# Patient Record
Sex: Male | Born: 1975 | Race: Black or African American | Hispanic: No | Marital: Single | Smoking: Never smoker
Health system: Southern US, Community
[De-identification: ages and names within clinical notes are randomized; demographics above are authoritative.]

## PROBLEM LIST (undated history)

## (undated) DIAGNOSIS — J45909 Unspecified asthma, uncomplicated: Secondary | ICD-10-CM

## (undated) DIAGNOSIS — J9859 Other diseases of mediastinum, not elsewhere classified: Secondary | ICD-10-CM

## (undated) DIAGNOSIS — Z889 Allergy status to unspecified drugs, medicaments and biological substances status: Secondary | ICD-10-CM

## (undated) DIAGNOSIS — Z9189 Other specified personal risk factors, not elsewhere classified: Secondary | ICD-10-CM

## (undated) HISTORY — DX: Other specified personal risk factors, not elsewhere classified: Z91.89

## (undated) HISTORY — DX: Allergy status to unspecified drugs, medicaments and biological substances: Z88.9

## (undated) HISTORY — DX: Other diseases of mediastinum, not elsewhere classified: J98.59

---

## 1998-02-06 ENCOUNTER — Emergency Department (HOSPITAL_COMMUNITY): Admission: EM | Admit: 1998-02-06 | Discharge: 1998-02-06 | Payer: Self-pay | Admitting: Emergency Medicine

## 1999-03-17 ENCOUNTER — Emergency Department (HOSPITAL_COMMUNITY): Admission: EM | Admit: 1999-03-17 | Discharge: 1999-03-17 | Payer: Self-pay | Admitting: Emergency Medicine

## 1999-03-17 ENCOUNTER — Encounter: Payer: Self-pay | Admitting: Emergency Medicine

## 2000-09-04 ENCOUNTER — Emergency Department (HOSPITAL_COMMUNITY): Admission: EM | Admit: 2000-09-04 | Discharge: 2000-09-05 | Payer: Self-pay | Admitting: Emergency Medicine

## 2000-09-04 ENCOUNTER — Encounter: Payer: Self-pay | Admitting: Emergency Medicine

## 2001-10-18 ENCOUNTER — Emergency Department (HOSPITAL_COMMUNITY): Admission: EM | Admit: 2001-10-18 | Discharge: 2001-10-19 | Payer: Self-pay

## 2012-07-02 ENCOUNTER — Emergency Department (HOSPITAL_COMMUNITY)
Admission: EM | Admit: 2012-07-02 | Discharge: 2012-07-02 | Disposition: A | Discharge status: Home / Self Care | Payer: BC Managed Care – PPO | Attending: Emergency Medicine | Admitting: Emergency Medicine

## 2012-07-02 DIAGNOSIS — M25469 Effusion, unspecified knee: Secondary | ICD-10-CM | POA: Insufficient documentation

## 2012-07-02 DIAGNOSIS — M7040 Prepatellar bursitis, unspecified knee: Secondary | ICD-10-CM | POA: Insufficient documentation

## 2012-07-02 MED ORDER — ACETAMINOPHEN 325 MG PO TABS
650.0000 mg | ORAL_TABLET | Freq: Once | ORAL | Status: AC
  Administered 2012-07-02: 650 mg via ORAL

## 2012-07-02 MED ORDER — OXYCODONE-ACETAMINOPHEN 5-325 MG PO TABS: ORAL_TABLET | ORAL | Status: DC

## 2012-07-02 MED ORDER — ACETAMINOPHEN 325 MG PO TABS
ORAL_TABLET | ORAL | Status: AC
  Administered 2012-07-02: 650 mg via ORAL
  Filled 2012-07-02: qty 2

## 2012-07-02 NOTE — ED Notes (Signed)
Pt states he woke up this morning with pain and swelling to R knee. Pt went to [facility] for same today and was given Toradol for pain. Pt states they x-rayed his knee and told him there was lots of fluid on his knee. Pt denies any trauma or injury to knee. Pt ambulatory with limp to exam room.

## 2012-07-02 NOTE — ED Notes (Signed)
Pt states he has crutches at home and doesn't need them.

## 2012-07-02 NOTE — ED Provider Notes (Signed)
History   This chart was scribed for Wynetta Emery PA-C, non-physician practitioner, working with Hurman Horn, MD by Charolett Bumpers, ED Scribe. This patient was seen in room WTR7/WTR7 and the patient's care was started at 2025.    CSN: [id number]  Arrival date & time 07/02/12  1950   First MD Initiated Contact with Patient 07/02/12 2025      Chief Complaint  Patient presents with  . Knee Pain    The history is provided by the patient. No language interpreter was used.  Jesse Rowe is a 37 y.o. male who presents to the Emergency Department complaining of constant, severe right knee pain with associated swelling. He states he woke up with these symptoms this morning. He went to [facility] today and was given IM Toradol for pain. He states his knee was x-rayed and told he had a lot of fluid on his knee. He denies any known injuries, trauma or falls. He rates his pain 7/10 at rest, 10/10 when he ambulates which exacerbates his pain. He denies any fevers. He reports a h/o similar symptoms in his right knee in 1994. He denies any h/o STD or recent dysuria, penile discharge, fever or rash.   No past medical history on file.  No past surgical history on file.  No family history on file.  History  Substance Use Topics  . Smoking status: Not on file  . Smokeless tobacco: Not on file  . Alcohol Use: Not on file      Review of Systems  Constitutional: Negative for fever and chills.  Respiratory: Negative for shortness of breath.   Cardiovascular: Negative for chest pain.  Gastrointestinal: Negative for nausea, vomiting, abdominal pain and diarrhea.  Genitourinary: Negative for dysuria and discharge.  Musculoskeletal: Positive for joint swelling and arthralgias.  All other systems reviewed and are negative.    Allergies  Review of patient's allergies indicates no known allergies.  Home Medications  No current outpatient prescriptions on file.  BP 142/81   Pulse 108  Temp 97.8 F (36.6 C) (Oral)  Resp 20  SpO2 98%  Physical Exam  Nursing note and vitals reviewed. Constitutional: He is oriented to person, place, and time. He appears well-developed and well-nourished. No distress.  HENT:  Head: Normocephalic.  Eyes: Conjunctivae normal and EOM are normal.  Neck: Neck supple.  Cardiovascular: Normal rate.   Pulmonary/Chest: Effort normal. No stridor. No respiratory distress.  Musculoskeletal: Normal range of motion. He exhibits tenderness.       Right knee with moderate effusion and diffuse tenderness to palpation. No warmth, erythema or induration noted. Full active ROM with pain.   Neurological: He is alert and oriented to person, place, and time.  Skin: Skin is warm and dry.  Psychiatric: He has a normal mood and affect.    ED Course  Procedures (including critical care time)  DIAGNOSTIC STUDIES: Oxygen Saturation is 98% on room air, normal by my interpretation.    COORDINATION OF CARE:  20:40-Discussed planned course of treatment with the patient, who is agreeable at this time.   20:50-Pt was examined by Dr. Fonnie Jarvis. Will d/c pt home with crutches, knee immobilizer and prescription for Percocet.  Labs Reviewed - No data to display No results found.   1. Prepatellar bursitis       MDM  Doubt septic joint based on physical exam. Discussed case with attending who evaluated the patient personally. He also agrees that a joint tap is necessary at this  time.   Pt verbalized understanding and agrees with care plan. Outpatient follow-up and return precautions given.    New Prescriptions   OXYCODONE-ACETAMINOPHEN (PERCOCET/ROXICET) 5-325 MG PER TABLET    1 to 2 tabs PO q6hrs  PRN for pain    I personally performed the services described in this documentation, which was scribed in my presence. The recorded information has been reviewed and is accurate.    Wynetta Emery, PA-C 07/02/12 2245

## 2012-07-05 NOTE — ED Provider Notes (Signed)
Medical screening examination/treatment/procedure(s) were conducted as a shared visit with non-physician practitioner(s) and myself.  I personally evaluated the patient during the encounter.  No erythema, no increased warmth, and no lateral medial or posterior joint tenderness, the patient appears to have prepatellar and suprapatellar tenderness only and I doubt septic arthritis.  Hurman Horn, MD 07/05/12 [phone]

## 2013-12-14 ENCOUNTER — Encounter (INDEPENDENT_AMBULATORY_CARE_PROVIDER_SITE_OTHER): Payer: Self-pay | Admitting: Ophthalmology

## 2017-06-12 ENCOUNTER — Other Ambulatory Visit: Payer: Self-pay | Admitting: Otolaryngology

## 2017-06-15 ENCOUNTER — Other Ambulatory Visit: Payer: Self-pay | Admitting: Otolaryngology

## 2017-06-15 DIAGNOSIS — J329 Chronic sinusitis, unspecified: Secondary | ICD-10-CM

## 2017-06-15 DIAGNOSIS — J342 Deviated nasal septum: Secondary | ICD-10-CM

## 2017-06-19 ENCOUNTER — Ambulatory Visit
Admission: RE | Admit: 2017-06-19 | Discharge: 2017-06-19 | Disposition: A | Discharge status: Home / Self Care | Payer: BC Managed Care – PPO | Source: Ambulatory Visit | Attending: Otolaryngology | Admitting: Otolaryngology

## 2017-06-19 DIAGNOSIS — J342 Deviated nasal septum: Secondary | ICD-10-CM

## 2017-06-19 DIAGNOSIS — J329 Chronic sinusitis, unspecified: Secondary | ICD-10-CM

## 2017-08-31 ENCOUNTER — Emergency Department (HOSPITAL_COMMUNITY)
Admission: EM | Admit: 2017-08-31 | Discharge: 2017-08-31 | Disposition: A | Discharge status: Home / Self Care | Payer: BC Managed Care – PPO | Attending: Emergency Medicine | Admitting: Emergency Medicine

## 2017-08-31 ENCOUNTER — Other Ambulatory Visit: Payer: Self-pay | Admitting: Internal Medicine

## 2017-08-31 ENCOUNTER — Other Ambulatory Visit: Payer: Self-pay

## 2017-08-31 ENCOUNTER — Emergency Department (HOSPITAL_COMMUNITY): Payer: BC Managed Care – PPO

## 2017-08-31 ENCOUNTER — Other Ambulatory Visit (HOSPITAL_COMMUNITY): Payer: Self-pay | Admitting: Internal Medicine

## 2017-08-31 ENCOUNTER — Ambulatory Visit
Admission: RE | Admit: 2017-08-31 | Discharge: 2017-08-31 | Disposition: A | Discharge status: Home / Self Care | Payer: BC Managed Care – PPO | Source: Ambulatory Visit | Attending: Internal Medicine | Admitting: Internal Medicine

## 2017-08-31 ENCOUNTER — Encounter (HOSPITAL_COMMUNITY): Payer: Self-pay | Admitting: Emergency Medicine

## 2017-08-31 DIAGNOSIS — Z79899 Other long term (current) drug therapy: Secondary | ICD-10-CM | POA: Insufficient documentation

## 2017-08-31 DIAGNOSIS — R071 Chest pain on breathing: Secondary | ICD-10-CM

## 2017-08-31 DIAGNOSIS — R079 Chest pain, unspecified: Secondary | ICD-10-CM | POA: Diagnosis present

## 2017-08-31 DIAGNOSIS — R222 Localized swelling, mass and lump, trunk: Secondary | ICD-10-CM | POA: Diagnosis not present

## 2017-08-31 LAB — BASIC METABOLIC PANEL
ANION GAP: 9 (ref 5–15)
BUN: 10 mg/dL (ref 6–20)
CHLORIDE: 103 mmol/L (ref 101–111)
CO2: 27 mmol/L (ref 22–32)
Calcium: 9.5 mg/dL (ref 8.9–10.3)
Creatinine, Ser: 1.17 mg/dL (ref 0.61–1.24)
GFR calc Af Amer: >60 mL/min (ref >60)
Glucose, Bld: 93 mg/dL (ref 65–99)
POTASSIUM: 3.6 mmol/L (ref 3.5–5.1)
Sodium: 139 mmol/L (ref 135–145)

## 2017-08-31 LAB — CBC
HEMATOCRIT: 44.4 % (ref 39.0–52.0)
HEMOGLOBIN: 14.7 g/dL (ref 13.0–17.0)
MCH: 29.6 pg (ref 26.0–34.0)
MCHC: 33.1 g/dL (ref 30.0–36.0)
MCV: 89.3 fL (ref 78.0–100.0)
Platelets: 221 10*3/uL (ref 150–400)
RBC: 4.97 MIL/uL (ref 4.22–5.81)
RDW: 12.2 % (ref 11.5–15.5)
WBC: 8.4 10*3/uL (ref 4.0–10.5)

## 2017-08-31 LAB — I-STAT TROPONIN, ED
TROPONIN I, POC: 0.02 ng/mL (ref 0.00–0.08)
Troponin i, poc: 0 ng/mL (ref 0.00–0.08)

## 2017-08-31 MED ORDER — HYDROCODONE-ACETAMINOPHEN 5-325 MG PO TABS
1.0000 | ORAL_TABLET | Freq: Once | ORAL | Status: AC
  Administered 2017-08-31: 1 via ORAL
  Filled 2017-08-31: qty 1

## 2017-08-31 MED ORDER — IOPAMIDOL (ISOVUE-370) INJECTION 76%
100.0000 mL | Freq: Once | INTRAVENOUS | Status: AC | PRN
  Administered 2017-08-31: 100 mL via INTRAVENOUS

## 2017-08-31 MED ORDER — HYDROCODONE-ACETAMINOPHEN 5-325 MG PO TABS: 1.0000 | ORAL_TABLET | Freq: Four times a day (QID) | ORAL | 0 refills | Status: DC | PRN

## 2017-08-31 NOTE — ED Provider Notes (Signed)
Patient placed in Quick Look pathway, seen and evaluated   Chief Complaint: Chest pain  HPI:   Patient began having sudden onset chest pain, central, radiating to the right, moderate to severe, constant since onset around 8 AM this morning.  Went to his PCP.  He was told his EKG was concerning.  There is also a mass noted on chest x-ray.  Increased pain with deep breathing.  Denies cough, fever, vomiting, peripheral edema.  ROS: Chest pain (one)  Physical Exam:   Gen: No distress  Neuro: Awake and Alert  Skin: Warm    Focused Exam:   No diaphoresis.  No pallor.  Pulmonary: No increased work of breathing.  Speaks in full sentences without difficulty.  Lung sounds clear.  No tachypnea.  Cardiac: Normal rate and regular. Peripheral pulses intact.  Abdominal: No abdominal tenderness. No guarding.    MSK: No peripheral edema.   Initiation of care has begun. The patient has been counseled on the process, plan, and necessity for staying for the completion/evaluation, and the remainder of the medical screening examination   Jesse Rowe 08/31/17 [place], Nathan, MD 08/31/17 2155

## 2017-08-31 NOTE — Discharge Instructions (Signed)
-  Please call and follow-up with your primary care doctor soon as possible for a thoracic spine MRI with contrast to further evaluate this posterior thoracic mass. -We will also need a referral to further specialist care through your PCP regarding this mass for further diagnosis after MRI

## 2017-08-31 NOTE — ED Triage Notes (Signed)
Pt states he started having right sided chest pain at 8am this morning. Denies any N/V, lightheadedness. Pt went to his primary MD and was sent here after a concerning EKG and chest xray that revealed paramediastinal mass.

## 2017-09-01 ENCOUNTER — Other Ambulatory Visit: Payer: Self-pay | Admitting: Internal Medicine

## 2017-09-01 ENCOUNTER — Ambulatory Visit
Admission: RE | Admit: 2017-09-01 | Discharge: 2017-09-01 | Disposition: A | Discharge status: Home / Self Care | Payer: BC Managed Care – PPO | Source: Ambulatory Visit | Attending: Internal Medicine | Admitting: Internal Medicine

## 2017-09-01 DIAGNOSIS — J9859 Other diseases of mediastinum, not elsewhere classified: Secondary | ICD-10-CM

## 2017-09-01 MED ORDER — GADOBENATE DIMEGLUMINE 529 MG/ML IV SOLN
20.0000 mL | Freq: Once | INTRAVENOUS | Status: AC | PRN
  Administered 2017-09-01: 20 mL via INTRAVENOUS

## 2017-09-08 ENCOUNTER — Other Ambulatory Visit: Payer: Self-pay | Admitting: Internal Medicine

## 2017-09-08 DIAGNOSIS — J9859 Other diseases of mediastinum, not elsewhere classified: Secondary | ICD-10-CM

## 2017-09-12 ENCOUNTER — Ambulatory Visit
Admission: RE | Admit: 2017-09-12 | Discharge: 2017-09-12 | Disposition: A | Discharge status: Home / Self Care | Payer: BC Managed Care – PPO | Source: Ambulatory Visit | Attending: Internal Medicine | Admitting: Internal Medicine

## 2017-09-12 ENCOUNTER — Other Ambulatory Visit: Payer: BC Managed Care – PPO

## 2017-09-12 DIAGNOSIS — J9859 Other diseases of mediastinum, not elsewhere classified: Secondary | ICD-10-CM

## 2017-09-12 MED ORDER — GADOBENATE DIMEGLUMINE 529 MG/ML IV SOLN
20.0000 mL | Freq: Once | INTRAVENOUS | Status: AC | PRN
  Administered 2017-09-12: 20 mL via INTRAVENOUS

## 2017-09-27 NOTE — ED Provider Notes (Signed)
[place] EMERGENCY DEPARTMENT Provider Note   CSN: [id number] Arrival date & time: 08/31/17  1232     History   Chief Complaint Chief Complaint  Patient presents with  . Chest Pain    HPI Divonte Senger is a 42 y.o. male.  42yo M p/w chest pain.  This is a new problem. The current episode started 6 to 12 hours ago. The problem occurs constantly. The problem has not changed since onset.The pain is associated with breathing. Pain location: L sided. The pain is moderate. The quality of the pain is described as dull. Radiates to: R chest. Duration of episode(s) is 1 day. Pertinent negatives include no abdominal pain, no back pain, no cough, no exertional chest pressure, no fever, no headaches, no irregular heartbeat, no lower extremity edema, no nausea, no near-syncope, no palpitations, no shortness of breath, no sputum production and no vomiting. He has tried nothing for the symptoms.   -Pt sent by PCP after CXR showed mass   The history is provided by the patient.  Chest Pain      History reviewed. No pertinent past medical history.  There are no active problems to display for this patient.   History reviewed. No pertinent surgical history.      Home Medications    Prior to Admission medications   Medication Sig Start Date End Date Taking? Authorizing Provider  albuterol (PROVENTIL HFA;VENTOLIN HFA) 108 (90 BASE) MCG/ACT inhaler Inhale 2 puffs into the lungs every 6 (six) hours as needed. For shortness of breath.   Yes [provider]  azelastine (ASTELIN) 0.1 % nasal spray Place 2 sprays into the nose daily as needed. 03/02/17  Yes [provider]  EPINEPHrine (EPIPEN 2-PAK IJ) Inject 1 Syringe as directed as needed. For allergic reaction   Yes [provider]  HYDROcodone-acetaminophen (NORCO/VICODIN) 5-325 MG tablet Take 1 tablet by mouth every 6 (six) hours as needed for up to 12 doses for moderate pain or severe pain  (for pain not controlled by acetaminophen or ibuprofen alone). 08/31/17   Tobie Poet, DO    Family History History reviewed. No pertinent family history.  Social History Social History   Tobacco Use  . Smoking status: Never Smoker  . Smokeless tobacco: Never Used  Substance Use Topics  . Alcohol use: Yes    Frequency: Never    Comment: occasional  . Drug use: Never     Allergies   Patient has no known allergies.   Review of Systems Review of Systems  Cardiovascular: Positive for chest pain.   All other systems reviewed and are negative except that which was mentioned in HPI   Physical Exam Updated Vital Signs BP 127/72   Pulse 74   Temp 98.2 F (36.8 C)   Resp 18   Ht 5\' 10"  (1.778 m)   Wt 98.9 kg (218 lb)   SpO2 94%   BMI 31.28 kg/m   Physical Exam  Constitutional: He is oriented to person, place, and time. He appears well-developed and well-nourished. No distress.  HENT:  Head: Normocephalic and atraumatic.  Moist mucous membranes  Eyes: Pupils are equal, round, and reactive to light. Conjunctivae are normal.  Neck: Neck supple.  Cardiovascular: Normal rate, regular rhythm and normal heart sounds.  No murmur heard. Pulmonary/Chest: Effort normal. He has decreased breath sounds in the left middle field.  Abdominal: Soft. Bowel sounds are normal. He exhibits no distension. There is no tenderness.  Musculoskeletal: He exhibits no  edema.  Neurological: He is alert and oriented to person, place, and time.  Fluent speech  Skin: Skin is warm and dry.  Psychiatric: Judgment normal. His mood appears anxious.  Nursing note and vitals reviewed.    ED Treatments / Results  Labs (all labs ordered are listed, but only abnormal results are displayed) Labs Reviewed  BASIC METABOLIC PANEL  CBC  I-STAT TROPONIN, ED  I-STAT TROPONIN, ED    EKG EKG Interpretation  Date/Time:  Monday August 31 2017 12:38:34 EDT Ventricular Rate:  80 PR Interval:  170 QRS  Duration: 100 QT Interval:  362 QTC Calculation: 417 R Axis:   28 Text Interpretation:  Normal sinus rhythm Normal ECG No previous ECGs available Confirmed by Theotis Burrow [phone]) on 08/31/2017 8:14:29 PM   Radiology No results found.  Procedures Procedures (including critical care time)  Medications Ordered in ED Medications  iopamidol (ISOVUE-370) 76 % injection 100 mL (100 mLs Intravenous Contrast Given 08/31/17 1514)  HYDROcodone-acetaminophen (NORCO/VICODIN) 5-325 MG per tablet 1 tablet (1 tablet Oral Given 08/31/17 1909)     Initial Impression / Assessment and Plan / ED Course  I have reviewed the triage vital signs and the nursing notes.  Pertinent labs & imaging results that were available during my care of the patient were reviewed by me and considered in my medical decision making (see chart for details).    Pt is a 39yoM sent by PCP for 1d of left sided chest pain and apparent mediastinal mass. On exam, NAD, normal heart sounds. Diminished L mid lung field sounds but otherwise clear. No resp distress or tachypnea. Neuro exam wnl. Labs as above unremarkable including 2 trops. EKG unremarkable.  CTA PE scan ordered to further evaluate and also r/o PE. 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 Non-emergent MRI of T spine warranted.  Resident Dr. Nyoka Lint and I explained findings to pt and stressed importance of close PCP follow up for this MRI and subsequent referrals depending on what is found. Norco given for pain. Short course of norco prescribed. Strict return precautions given.  Pt dc in satisfactory condition.   VISIT WAS CONDUCTED AS A SHARED VISIT BETWEEN MYSELF AND RESIDENT PHYSICIAN Tobie Poet, MD.   Final Clinical Impressions(s) / ED Diagnoses   Final diagnoses:  Chest pain, unspecified type  Chest mass    ED Discharge Orders        Ordered    HYDROcodone-acetaminophen  (NORCO/VICODIN) 5-325 MG tablet  Every 6 hours PRN     08/31/17 2030       Jadrian Bulman, Wenda Overland, MD 09/27/17 1637

## 2017-09-29 ENCOUNTER — Encounter: Payer: BC Managed Care – PPO | Admitting: Cardiothoracic Surgery

## 2017-09-30 ENCOUNTER — Institutional Professional Consult (permissible substitution): Payer: BC Managed Care – PPO | Admitting: Cardiothoracic Surgery

## 2017-09-30 ENCOUNTER — Encounter: Payer: Self-pay | Admitting: Cardiothoracic Surgery

## 2017-09-30 ENCOUNTER — Other Ambulatory Visit: Payer: Self-pay | Admitting: *Deleted

## 2017-09-30 ENCOUNTER — Other Ambulatory Visit: Payer: Self-pay

## 2017-09-30 VITALS — BP 129/85 | HR 87 | Resp 16 | Ht 70.0 in | Wt 218.0 lb

## 2017-09-30 DIAGNOSIS — R222 Localized swelling, mass and lump, trunk: Secondary | ICD-10-CM | POA: Diagnosis not present

## 2017-09-30 DIAGNOSIS — J9859 Other diseases of mediastinum, not elsewhere classified: Secondary | ICD-10-CM | POA: Insufficient documentation

## 2017-09-30 NOTE — Progress Notes (Signed)
PCP is Seward Carol, MD Referring Provider is Seward Carol, MD  Patient examined and images of chest CT scan and thoracic MRI personally reviewed, demonstrated to patient, and counseled with patient.  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  He has had symptoms of chest and mid back pain and was evaluated at the emergency department.  Cardiac enzymes were negative.  EKG was normal.  Chest x-ray showed a posterior thoracic mass and a CT angiogram showed no evidence of pulmonary embolus with a 4.2 cm smooth round mass adjacent to the spine at the level of T7 and also adjacent to the aorta.  MRI with contrast shows no evidence of invasion with extension into the neuroforamina.  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  Patient's general health has been good.  He is currently taking antibiotics for sinusitis.  He just started on Monday.  He has had nasal drainage with congestion but no productive cough.  No fever. He denies any previous thoracic trauma pneumothorax or rib fractures. The patient is left-hand dominant.  There is no history of neurofibromatosis in the family.   Past Medical History:  Diagnosis Date  . History of multiple allergies   . Mediastinal mass     History reviewed. No pertinent surgical history.  Family History  Problem Relation Age of Onset  . Colon cancer Mother   . CVA Mother   . Diabetes Mother   . Hypertension Father   . Hyperlipidemia Father     Social History Social History   Tobacco Use  . Smoking status: Never Smoker  . Smokeless tobacco: Never Used  Substance Use Topics  . Alcohol use: Yes    Frequency: Never    Comment: occasional  . Drug use: Never    Current Outpatient Medications  Medication Sig Dispense Refill  . albuterol (PROVENTIL HFA;VENTOLIN HFA) 108 (90 BASE) MCG/ACT inhaler Inhale 2 puffs into the lungs every 6 (six) hours  as needed. For shortness of breath.    Marland Kitchen azelastine (ASTELIN) 0.1 % nasal spray Place 2 sprays into the nose daily as needed.    . cetirizine (ZYRTEC) 10 MG tablet Take 10 mg by mouth daily.    Marland Kitchen EPINEPHrine (EPIPEN 2-PAK IJ) Inject 1 Syringe as directed as needed. For allergic reaction    . HYDROcodone-acetaminophen (NORCO/VICODIN) 5-325 MG tablet Take 1 tablet by mouth every 6 (six) hours as needed for up to 12 doses for moderate pain or severe pain (for pain not controlled by acetaminophen or ibuprofen alone). 12 tablet 0  . traMADol (ULTRAM) 50 MG tablet Take by mouth every 6 (six) hours as needed.    . montelukast (SINGULAIR) 10 MG tablet Take 10 mg by mouth at bedtime.     No current facility-administered medications for this visit.     No Known Allergies  Review of Systems         Review of Systems :  [ y ] = yes, [  ] = no        General :  Weight gain [   ]    Weight loss  [   ]  Fatigue [  ]  Fever [  ]  Chills  [  ]  Weakness  [  ]           HEENT    Headache [  ]  Dizziness [  ]  Blurred vision [  ] Glaucoma  [  ]                          Nosebleeds [  ] Painful or loose teeth [  ]        Cardiac :  Chest pain/ pressure [ y ]  Resting SOB [  ] exertional SOB [  ]                        Orthopnea [  ]  Pedal edema  [  ]  Palpitations [  ] Syncope/presyncope [ ]                         Paroxysmal nocturnal dyspnea [  ]         Pulmonary : cough [  ]  wheezing [  ]  Hemoptysis [  ] Sputum [  ] Snoring Blue.Reese  ]                              Pneumothorax [  ]  Sleep apnea [  ]        GI : Vomiting [  ]  Dysphagia [  ]  Melena  [  ]  Abdominal pain [  ] BRBPR [  ]              Heart burn [  ]  Constipation [  ] Diarrhea  [  ] Colonoscopy [   ]        GU : Hematuria [  ]  Dysuria [  ]  Nocturia [  ] UTI's [  ]        Vascular : Claudication [  ]  Rest pain [  ]  DVT [  ] Vein stripping [  ] leg ulcers [  ]                          TIA [  ] Stroke [   ]  Varicose veins [  ]        NEURO :  Headaches  [  ] Seizures [  ] Vision changes [  ] Paresthesias [  ]                                       Seizures [  ] left hand dominant        Musculoskeletal :  Arthritis [  ] Gout  [  ]  Back pain [ y ]  Joint pain [  ]        Skin :  Rash [  ]  Melanoma [  ] Sores [  ]        Heme : Bleeding problems [  ]Clotting Disorders [  ] Anemia [  ]Blood Transfusion [ ]         Endocrine : Diabetes [  ] Heat or Cold intolerance [  ] Polyuria [  ]excessive thirst [ ]         Psych :  Depression [  ]  Anxiety [  ]  Psych hospitalizations [  ] Memory change [  ]                                               BP 129/85 (BP Location: Right Arm, Patient Position: Sitting, Cuff Size: Large)   Pulse 87   Resp 16   Ht 5\' 10"  (1.778 m)   Wt 218 lb (98.9 kg)   SpO2 98% Comment: ON RA  BMI 31.28 kg/m  Physical Exam        Exam    General- alert and comfortable.  Heavyset middle-aged male no acute distress    Neck- no JVD, no cervical adenopathy palpable, no carotid bruit   Lungs- clear without rales, wheezes   Cor- regular rate and rhythm, no murmur , gallop   Abdomen- soft, non-tender   Extremities - warm, non-tender, minimal edema   Neuro- oriented, appropriate, no focal weakness    Diagnostic Tests: CT scan of chest and thoracic spine MRA images demonstrate the 4.2 cm round ovoid density at the level of T6-T7 consistent with schwannoma  Impression: I recommended surgical resection the patient.  He understands that this is probably not a malignant tumor but a tumor that will continue to increase in size and cause symptoms.  Prior to scheduling surgery I will review the MRI images with Dr. Vertell Limber to determine if a neurosurgical combined approach will be necessary for resection.  The patient will also be scheduled for pulmonary function testing.  He will need to finish his course of antibiotics and optimize his chronic sinusitis prior to  surgery.  Plan: Return on May 7 to review the neurosurgery recommendation and results of PFTs and to schedule surgery.   Len Childs, MD [facility] [phone]

## 2017-10-12 ENCOUNTER — Ambulatory Visit (HOSPITAL_COMMUNITY)
Admission: RE | Admit: 2017-10-12 | Discharge: 2017-10-12 | Disposition: A | Discharge status: Home / Self Care | Payer: BC Managed Care – PPO | Source: Ambulatory Visit | Attending: Cardiothoracic Surgery | Admitting: Cardiothoracic Surgery

## 2017-10-12 DIAGNOSIS — J9859 Other diseases of mediastinum, not elsewhere classified: Secondary | ICD-10-CM | POA: Diagnosis not present

## 2017-10-12 LAB — PULMONARY FUNCTION TEST
DL/VA % pred: 116 %
DL/VA: 5.37 ml/min/mmHg/L
DLCO unc % pred: 103 %
DLCO unc: 31.98 ml/min/mmHg
FEF 25-75 Post: 3.13 L/sec
FEF 25-75 Pre: 2.75 L/sec
FEF2575-%Change-Post: 13 %
FEF2575-%Pred-Post: 87 %
FEF2575-%Pred-Pre: 76 %
FEV1-%Change-Post: 3 %
FEV1-%Pred-Post: 104 %
FEV1-%Pred-Pre: 101 %
FEV1-Post: 3.59 L
FEV1-Pre: 3.47 L
FEV1FVC-%Change-Post: 4 %
FEV1FVC-%Pred-Pre: 92 %
FEV6-%Change-Post: 0 %
FEV6-%Pred-Post: 109 %
FEV6-%Pred-Pre: 109 %
FEV6-Post: 4.51 L
FEV6-Pre: 4.5 L
FEV6FVC-%Change-Post: 1 %
FEV6FVC-%Pred-Post: 102 %
FEV6FVC-%Pred-Pre: 100 %
FVC-%Change-Post: 0 %
FVC-%Pred-Post: 107 %
FVC-%Pred-Pre: 108 %
FVC-Post: 4.53 L
FVC-Pre: 4.55 L
Post FEV1/FVC ratio: 79 %
Post FEV6/FVC ratio: 100 %
Pre FEV1/FVC ratio: 76 %
Pre FEV6/FVC Ratio: 99 %
RV % pred: 164 %
RV: 2.98 L
TLC % pred: 107 %
TLC: 7.25 L

## 2017-10-12 MED ORDER — ALBUTEROL SULFATE (2.5 MG/3ML) 0.083% IN NEBU
2.5000 mg | INHALATION_SOLUTION | Freq: Once | RESPIRATORY_TRACT | Status: AC
  Administered 2017-10-12: 2.5 mg via RESPIRATORY_TRACT

## 2017-10-13 ENCOUNTER — Encounter: Payer: BC Managed Care – PPO | Admitting: Cardiothoracic Surgery

## 2017-10-14 ENCOUNTER — Encounter: Payer: BC Managed Care – PPO | Admitting: Cardiothoracic Surgery

## 2017-10-15 ENCOUNTER — Ambulatory Visit: Payer: BC Managed Care – PPO | Admitting: Cardiothoracic Surgery

## 2017-10-15 ENCOUNTER — Other Ambulatory Visit: Payer: Self-pay

## 2017-10-15 ENCOUNTER — Encounter: Payer: Self-pay | Admitting: Cardiothoracic Surgery

## 2017-10-15 ENCOUNTER — Other Ambulatory Visit: Payer: Self-pay | Admitting: *Deleted

## 2017-10-15 VITALS — BP 127/84 | HR 100 | Resp 16 | Ht 70.0 in | Wt 218.0 lb

## 2017-10-15 DIAGNOSIS — J9859 Other diseases of mediastinum, not elsewhere classified: Secondary | ICD-10-CM

## 2017-10-15 DIAGNOSIS — R222 Localized swelling, mass and lump, trunk: Secondary | ICD-10-CM

## 2017-10-15 NOTE — Progress Notes (Signed)
PCP is Seward Carol, MD Referring Provider is Seward Carol, MD  Chief Complaint  Patient presents with  . 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  He returns for further review and to discuss results of pulmonary function testing and to discuss the need for neurosurgical intervention. I reviewed the MRI scans of his tumor with neurosurgeon Dr.Joseph Vertell Limber who felt that the tumor did not enter the spinal canal and a laminectomy or spinal incision would not be needed.  He recommended resection of the tumor with clipping of the nerve root involved.  Pulmonary function test show good mechanics of breathing [FVC, FEV1 both normal] and good diffusion capacity with DLCO 100%.  The patient will be scheduled for left VATS with resection of paravertebral mass on Tuesday, May 14 at [facility].  I again reviewed the details of surgery including the anesthesia technique, the location of the surgical incision, the expected postoperative recovery, the plan to use chest tube drainage of the pleural space postop, and the alternatives to surgery.  I discussed with him the risks of bleeding, pain, infection, air leak, pneumonia, organ failure, death.  He demonstrates his understanding and agreed to proceed with surgery under what I feel is an informed consent. Past Medical History:  Diagnosis Date  . History of multiple allergies   . Mediastinal mass     History reviewed. No pertinent surgical history.  Family History  Problem Relation Age of Onset  . Colon cancer Mother   . CVA Mother   . Diabetes Mother   . Hypertension Father   . Hyperlipidemia Father     Social History Social History   Tobacco Use  . Smoking status: Never Smoker  . Smokeless tobacco: Never Used  Substance Use Topics  . Alcohol use: Yes    Frequency: Never    Comment: occasional  . Drug use: Never    Current Outpatient  Medications  Medication Sig Dispense Refill  . albuterol (PROVENTIL HFA;VENTOLIN HFA) 108 (90 BASE) MCG/ACT inhaler Inhale 2 puffs into the lungs every 6 (six) hours as needed. For shortness of breath.    Marland Kitchen azelastine (ASTELIN) 0.1 % nasal spray Place 2 sprays into the nose daily as needed.    . cetirizine (ZYRTEC) 10 MG tablet Take 10 mg by mouth daily.    Marland Kitchen EPINEPHrine (EPIPEN 2-PAK IJ) Inject 1 Syringe as directed as needed. For allergic reaction    . HYDROcodone-acetaminophen (NORCO/VICODIN) 5-325 MG tablet Take 1 tablet by mouth every 6 (six) hours as needed for up to 12 doses for moderate pain or severe pain (for pain not controlled by acetaminophen or ibuprofen alone). 12 tablet 0  . montelukast (SINGULAIR) 10 MG tablet Take 10 mg by mouth at bedtime.    . traMADol (ULTRAM) 50 MG tablet Take by mouth every 6 (six) hours as needed.     No current facility-administered medications for this visit.     No Known Allergies  Review of Systems  No change since initial consultation Taking 1 dose of Naprosyn per day for back pain  BP 127/84 (BP Location: Right Arm, Patient Position: Sitting, Cuff Size: Large)   Pulse 100   Resp 16   Ht 5\' 10"  (1.778 m)   Wt 218 lb (98.9 kg)   SpO2 95% Comment: ON RA  BMI 31.28 kg/m  Physical Exam      Exam  General- alert and comfortable    Neck- no JVD, no cervical adenopathy palpable, no carotid bruit   Lungs- clear without rales, wheezes   Cor- regular rate and rhythm, no murmur , gallop   Abdomen- soft, non-tender   Extremities - warm, non-tender, minimal edema   Neuro- oriented, appropriate, no focal weakness   Diagnostic Tests: Results of neurosurgical recommendation and results of PFTs both discussed in detail with patient and wife  Impression: Left paravertebral mass, symptomatic, surgical resection plan  Plan: Patient will be prepared for surgery with a preop visit on Monday, May 13 for surgery Tuesday, May 14 at [place], MD [facility] [phone]

## 2017-10-19 ENCOUNTER — Ambulatory Visit (HOSPITAL_COMMUNITY)
Admission: RE | Admit: 2017-10-19 | Discharge: 2017-10-19 | Disposition: A | Discharge status: Home / Self Care | Payer: BC Managed Care – PPO | Source: Ambulatory Visit | Attending: Cardiothoracic Surgery | Admitting: Cardiothoracic Surgery

## 2017-10-19 ENCOUNTER — Encounter (HOSPITAL_COMMUNITY)
Admission: RE | Admit: 2017-10-19 | Discharge: 2017-10-19 | Disposition: A | Discharge status: Home / Self Care | Payer: BC Managed Care – PPO | Source: Ambulatory Visit | Attending: Cardiothoracic Surgery | Admitting: Cardiothoracic Surgery

## 2017-10-19 ENCOUNTER — Encounter (HOSPITAL_COMMUNITY): Payer: Self-pay

## 2017-10-19 ENCOUNTER — Other Ambulatory Visit: Payer: Self-pay

## 2017-10-19 DIAGNOSIS — Z01812 Encounter for preprocedural laboratory examination: Secondary | ICD-10-CM | POA: Insufficient documentation

## 2017-10-19 DIAGNOSIS — R222 Localized swelling, mass and lump, trunk: Secondary | ICD-10-CM

## 2017-10-19 DIAGNOSIS — J9859 Other diseases of mediastinum, not elsewhere classified: Secondary | ICD-10-CM | POA: Insufficient documentation

## 2017-10-19 DIAGNOSIS — Z01810 Encounter for preprocedural cardiovascular examination: Secondary | ICD-10-CM

## 2017-10-19 HISTORY — DX: Unspecified asthma, uncomplicated: J45.909

## 2017-10-19 LAB — COMPREHENSIVE METABOLIC PANEL
ALT: 23 U/L (ref 17–63)
AST: 28 U/L (ref 15–41)
Albumin: 4.2 g/dL (ref 3.5–5.0)
Alkaline Phosphatase: 45 U/L (ref 38–126)
Anion gap: 10 (ref 5–15)
BUN: 13 mg/dL (ref 6–20)
CO2: 21 mmol/L — ABNORMAL LOW (ref 22–32)
Calcium: 9.6 mg/dL (ref 8.9–10.3)
Chloride: 109 mmol/L (ref 101–111)
Creatinine, Ser: 1.12 mg/dL (ref 0.61–1.24)
GFR calc Af Amer: >60 mL/min (ref >60)
GFR calc non Af Amer: >60 mL/min (ref >60)
Glucose, Bld: 120 mg/dL — ABNORMAL HIGH (ref 65–99)
Potassium: 3.5 mmol/L (ref 3.5–5.1)
Sodium: 140 mmol/L (ref 135–145)
Total Bilirubin: 0.9 mg/dL (ref 0.3–1.2)
Total Protein: 7.2 g/dL (ref 6.5–8.1)

## 2017-10-19 LAB — BLOOD GAS, ARTERIAL
Acid-Base Excess: 0 mmol/L (ref 0.0–2.0)
Bicarbonate: 23.8 mmol/L (ref 20.0–28.0)
Drawn by: 449841
FIO2: 21
O2 Saturation: 97.8 %
Patient temperature: 98.6
pCO2 arterial: 36.3 mmHg (ref 32.0–48.0)
pH, Arterial: 7.432 (ref 7.350–7.450)
pO2, Arterial: 101 mmHg (ref 83.0–108.0)

## 2017-10-19 LAB — URINALYSIS, ROUTINE W REFLEX MICROSCOPIC
Bilirubin Urine: NEGATIVE
Glucose, UA: NEGATIVE mg/dL
Hgb urine dipstick: NEGATIVE
Ketones, ur: NEGATIVE mg/dL
Leukocytes, UA: NEGATIVE
Nitrite: NEGATIVE
Protein, ur: NEGATIVE mg/dL
Specific Gravity, Urine: 1.024 (ref 1.005–1.030)
pH: 5 (ref 5.0–8.0)

## 2017-10-19 LAB — CBC
HCT: 42.9 % (ref 39.0–52.0)
Hemoglobin: 14.5 g/dL (ref 13.0–17.0)
MCH: 29.9 pg (ref 26.0–34.0)
MCHC: 33.8 g/dL (ref 30.0–36.0)
MCV: 88.5 fL (ref 78.0–100.0)
Platelets: 187 10*3/uL (ref 150–400)
RBC: 4.85 MIL/uL (ref 4.22–5.81)
RDW: 12.2 % (ref 11.5–15.5)
WBC: 9.3 10*3/uL (ref 4.0–10.5)

## 2017-10-19 LAB — ABO/RH: ABO/RH(D): AB POS

## 2017-10-19 LAB — PROTIME-INR
INR: 1.03
Prothrombin Time: 13.4 seconds (ref 11.4–15.2)

## 2017-10-19 LAB — APTT: aPTT: 27 seconds (ref 24–36)

## 2017-10-19 LAB — SURGICAL PCR SCREEN
MRSA, PCR: NEGATIVE
Staphylococcus aureus: NEGATIVE

## 2017-10-19 NOTE — Pre-Procedure Instructions (Addendum)
Jesse Rowe  10/19/2017      [id number] - [place] Rowe, [place] - [address] [place] [address] Phone: [phone] Fax: [phone]    Your procedure is scheduled on 10/20/2017.  Report to [facility] Admitting at 0830 A.M.  Call this number if you have problems the morning of surgery:  [phone]   Remember:  Do not eat food or drink liquids after midnight.   Take these medicines the morning of surgery with A SIP OF WATER: Albuterol inhaler - if needed (Bring with you the morning of surgery) Nasal spray - if needed Cetirizine (Zyrtec) Hydrocodone-acetaminophen (Norco/vicodin) - if needed Tramadol (Ultram) - if needed  7 days prior to surgery STOP taking any Aspirin (unless otherwise instructed by your surgeon), Aleve, Naproxen, Ibuprofen, Motrin, Advil, Goody's, BC's, all herbal medications, fish oil, and all vitamins   Do not wear jewelry.  Do not wear lotions, powders, or colognes, or deodorant.  Men may shave face and neck.  Do not bring valuables to the hospital.  [facility] is not responsible for any belongings or valuables.  Hearing aids, eyeglasses, contacts, dentures or bridgework may not be worn into surgery.  Leave your suitcase in the car.  After surgery it may be brought to your room.  For patients admitted to the hospital, discharge time will be determined by your treatment team.  Patients discharged the day of surgery will not be allowed to drive home.   Name and phone number of your driver:    Special instructions:   [name]  Before surgery, you can play an important role. Because skin is not sterile, your skin needs to be as free of germs as possible. You can reduce the number of germs on your skin by washing with CHG (chlorahexidine gluconate) Soap before surgery.  CHG is an antiseptic cleaner which kills germs and bonds with the skin to continue killing germs even after  washing.  Oral Hygiene is also important to reduce your risk of infection.  Remember - BRUSH YOUR TEETH THE MORNING OF SURGERY  Please do not use if you have an allergy to CHG or antibacterial soaps. If your skin becomes reddened/irritated stop using the CHG.  Do not shave (including legs and underarms) for at least 48 hours prior to first CHG shower. It is OK to shave your face.  Please follow these instructions carefully.   1. Shower the NIGHT BEFORE SURGERY and the MORNING OF SURGERY with CHG.   2. If you chose to wash your hair, wash your hair first as usual with your normal shampoo.  3. After you shampoo, rinse your hair and body thoroughly to remove the shampoo.  4. Use CHG as you would any other liquid soap. You can apply CHG directly to the skin and wash gently with a scrungie or a clean washcloth.   5. Apply the CHG Soap to your body ONLY FROM THE NECK DOWN.  Do not use on open wounds or open sores. Avoid contact with your eyes, ears, mouth and genitals (private parts). Wash Face and genitals (private parts)  with your normal soap.  6. Wash thoroughly, paying special attention to the area where your surgery will be performed.  7. Thoroughly rinse your body with warm water from the neck down.  8. DO NOT shower/wash with your normal soap after using and rinsing off the CHG Soap.  9. Pat yourself dry with a CLEAN TOWEL.  10. Wear CLEAN PAJAMAS to bed the night before surgery, wear comfortable clothes the morning of surgery  11. Place CLEAN SHEETS on your bed the night of your first shower and DO NOT SLEEP WITH PETS.    Day of Surgery: Do not apply any deodorants/lotions. Please wear clean clothes to the hospital/surgery center.  Remember to brush your teeth.      Please read over the following fact sheets that you were given.

## 2017-10-19 NOTE — Progress Notes (Signed)
PCP - Dr. Randel Pigg  Cardiologist - patient denies  Chest x-ray - 10/19/2017 EKG - 10/07/2017 Stress Test - patient denies ECHO - patient denies Cardiac Cath - patient denies  Sleep Study - patient denies  Anesthesia review: n/a  Patient denies shortness of breath, fever, cough and chest pain at PAT appointment   Patient verbalized understanding of instructions that were given to them at the PAT appointment. Patient was also instructed that they will need to review over the PAT instructions again at home before surgery.

## 2017-10-20 ENCOUNTER — Inpatient Hospital Stay (HOSPITAL_COMMUNITY)
Admission: RE | Admit: 2017-10-20 | Discharge: 2017-10-24 | DRG: 516 | Disposition: A | Discharge status: Home / Self Care | Payer: BC Managed Care – PPO | Source: Ambulatory Visit | Attending: Cardiothoracic Surgery | Admitting: Cardiothoracic Surgery

## 2017-10-20 ENCOUNTER — Encounter (HOSPITAL_COMMUNITY)
Admission: RE | Disposition: A | Discharge status: Home / Self Care | Payer: Self-pay | Source: Ambulatory Visit | Attending: Cardiothoracic Surgery

## 2017-10-20 ENCOUNTER — Encounter (HOSPITAL_COMMUNITY): Payer: Self-pay | Admitting: *Deleted

## 2017-10-20 ENCOUNTER — Inpatient Hospital Stay (HOSPITAL_COMMUNITY): Payer: BC Managed Care – PPO | Admitting: Anesthesiology

## 2017-10-20 ENCOUNTER — Inpatient Hospital Stay (HOSPITAL_COMMUNITY): Payer: BC Managed Care – PPO

## 2017-10-20 DIAGNOSIS — Z800 Family history of malignant neoplasm of digestive organs: Secondary | ICD-10-CM

## 2017-10-20 DIAGNOSIS — D62 Acute posthemorrhagic anemia: Secondary | ICD-10-CM | POA: Diagnosis not present

## 2017-10-20 DIAGNOSIS — Z9889 Other specified postprocedural states: Secondary | ICD-10-CM

## 2017-10-20 DIAGNOSIS — J939 Pneumothorax, unspecified: Secondary | ICD-10-CM | POA: Diagnosis not present

## 2017-10-20 DIAGNOSIS — J45909 Unspecified asthma, uncomplicated: Secondary | ICD-10-CM | POA: Diagnosis present

## 2017-10-20 DIAGNOSIS — R222 Localized swelling, mass and lump, trunk: Secondary | ICD-10-CM

## 2017-10-20 DIAGNOSIS — D3610 Benign neoplasm of peripheral nerves and autonomic nervous system, unspecified: Secondary | ICD-10-CM | POA: Diagnosis present

## 2017-10-20 DIAGNOSIS — Z9689 Presence of other specified functional implants: Secondary | ICD-10-CM

## 2017-10-20 DIAGNOSIS — J9859 Other diseases of mediastinum, not elsewhere classified: Secondary | ICD-10-CM

## 2017-10-20 HISTORY — PX: VIDEO ASSISTED THORACOSCOPY: SHX5073

## 2017-10-20 HISTORY — PX: RESECTION OF MEDIASTINAL MASS: SHX6497

## 2017-10-20 LAB — BLOOD GAS, ARTERIAL
Acid-Base Excess: 0.8 mmol/L (ref 0.0–2.0)
Bicarbonate: 26.4 mmol/L (ref 20.0–28.0)
O2 Content: 4 L/min
O2 Saturation: 97.9 %
PCO2 ART: 51.6 mmHg — AB (ref 32.0–48.0)
Patient temperature: 96.4
pH, Arterial: 7.323 — ABNORMAL LOW (ref 7.350–7.450)
pO2, Arterial: 112 mmHg — ABNORMAL HIGH (ref 83.0–108.0)

## 2017-10-20 LAB — PREPARE RBC (CROSSMATCH)

## 2017-10-20 LAB — GLUCOSE, CAPILLARY: Glucose-Capillary: 157 mg/dL — ABNORMAL HIGH (ref 65–99)

## 2017-10-20 SURGERY — VIDEO ASSISTED THORACOSCOPY
Anesthesia: General | Site: Chest

## 2017-10-20 MED ORDER — PROPOFOL 10 MG/ML IV BOLUS
INTRAVENOUS | Status: DC | PRN
  Administered 2017-10-20: 200 mg via INTRAVENOUS
  Administered 2017-10-20: 50 mg via INTRAVENOUS

## 2017-10-20 MED ORDER — KETOROLAC TROMETHAMINE 30 MG/ML IJ SOLN
INTRAMUSCULAR | Status: DC | PRN
  Administered 2017-10-20: 30 mg via INTRAVENOUS

## 2017-10-20 MED ORDER — BUPIVACAINE 0.5 % ON-Q PUMP SINGLE CATH 400 ML
400.0000 mL | INJECTION | Status: AC
  Administered 2017-10-20: 400 mL
  Filled 2017-10-20: qty 400

## 2017-10-20 MED ORDER — ALBUTEROL SULFATE (2.5 MG/3ML) 0.083% IN NEBU: 2.5000 mg | INHALATION_SOLUTION | Freq: Four times a day (QID) | RESPIRATORY_TRACT | Status: DC | PRN

## 2017-10-20 MED ORDER — SENNOSIDES-DOCUSATE SODIUM 8.6-50 MG PO TABS
1.0000 | ORAL_TABLET | Freq: Every day | ORAL | Status: DC
  Administered 2017-10-20 – 2017-10-22 (×3): 1 via ORAL
  Filled 2017-10-20 (×3): qty 1

## 2017-10-20 MED ORDER — INSULIN ASPART 100 UNIT/ML ~~LOC~~ SOLN
0.0000 [IU] | SUBCUTANEOUS | Status: DC
  Administered 2017-10-20 – 2017-10-21 (×3): 2 [IU] via SUBCUTANEOUS

## 2017-10-20 MED ORDER — TRAMADOL HCL 50 MG PO TABS: 50.0000 mg | ORAL_TABLET | Freq: Four times a day (QID) | ORAL | Status: DC | PRN

## 2017-10-20 MED ORDER — HEMOSTATIC AGENTS (NO CHARGE) OPTIME
TOPICAL | Status: DC | PRN
  Administered 2017-10-20: 1 via TOPICAL

## 2017-10-20 MED ORDER — BUPIVACAINE HCL (PF) 0.5 % IJ SOLN
INTRAMUSCULAR | Status: AC
  Filled 2017-10-20: qty 30

## 2017-10-20 MED ORDER — DIPHENHYDRAMINE HCL 12.5 MG/5ML PO ELIX
12.5000 mg | ORAL_SOLUTION | Freq: Four times a day (QID) | ORAL | Status: DC | PRN
  Filled 2017-10-20: qty 5

## 2017-10-20 MED ORDER — SUGAMMADEX SODIUM 200 MG/2ML IV SOLN
INTRAVENOUS | Status: AC
  Filled 2017-10-20: qty 2

## 2017-10-20 MED ORDER — LACTATED RINGERS IV SOLN
INTRAVENOUS | Status: DC | PRN
  Administered 2017-10-20: 11:00:00 via INTRAVENOUS

## 2017-10-20 MED ORDER — DIPHENHYDRAMINE HCL 50 MG/ML IJ SOLN: 12.5000 mg | Freq: Four times a day (QID) | INTRAMUSCULAR | Status: DC | PRN

## 2017-10-20 MED ORDER — KETOROLAC TROMETHAMINE 30 MG/ML IJ SOLN
INTRAMUSCULAR | Status: AC
Start: 2017-10-20 — End: ?
  Filled 2017-10-20: qty 1

## 2017-10-20 MED ORDER — FLUTICASONE PROPIONATE 50 MCG/ACT NA SUSP
1.0000 | NASAL | Status: DC
  Administered 2017-10-21 – 2017-10-23 (×2): 1 via NASAL
  Filled 2017-10-20: qty 16

## 2017-10-20 MED ORDER — BISACODYL 5 MG PO TBEC
10.0000 mg | DELAYED_RELEASE_TABLET | Freq: Every day | ORAL | Status: DC
  Administered 2017-10-20 – 2017-10-23 (×4): 10 mg via ORAL
  Filled 2017-10-20 (×5): qty 2

## 2017-10-20 MED ORDER — PHENYLEPHRINE HCL 10 MG/ML IJ SOLN
INTRAVENOUS | Status: DC | PRN
  Administered 2017-10-20: 25 ug/min via INTRAVENOUS

## 2017-10-20 MED ORDER — MONTELUKAST SODIUM 10 MG PO TABS: 10.0000 mg | ORAL_TABLET | Freq: Every evening | ORAL | Status: DC | PRN

## 2017-10-20 MED ORDER — FENTANYL CITRATE (PF) 100 MCG/2ML IJ SOLN
INTRAMUSCULAR | Status: AC
  Filled 2017-10-20: qty 2

## 2017-10-20 MED ORDER — OXYCODONE HCL 5 MG PO TABS
5.0000 mg | ORAL_TABLET | ORAL | Status: DC | PRN
  Administered 2017-10-20 – 2017-10-22 (×3): 10 mg via ORAL
  Administered 2017-10-23: 5 mg via ORAL
  Administered 2017-10-23 – 2017-10-24 (×4): 10 mg via ORAL
  Administered 2017-10-24: 5 mg via ORAL
  Filled 2017-10-20 (×2): qty 2
  Filled 2017-10-20: qty 1
  Filled 2017-10-20 (×2): qty 2
  Filled 2017-10-20: qty 1
  Filled 2017-10-20 (×2): qty 2

## 2017-10-20 MED ORDER — FENTANYL CITRATE (PF) 250 MCG/5ML IJ SOLN
INTRAMUSCULAR | Status: AC
  Filled 2017-10-20: qty 5

## 2017-10-20 MED ORDER — MIDAZOLAM HCL 2 MG/2ML IJ SOLN
INTRAMUSCULAR | Status: AC
  Filled 2017-10-20: qty 2

## 2017-10-20 MED ORDER — KETOROLAC TROMETHAMINE 15 MG/ML IJ SOLN
15.0000 mg | Freq: Four times a day (QID) | INTRAMUSCULAR | Status: AC
  Administered 2017-10-20 – 2017-10-23 (×12): 15 mg via INTRAVENOUS
  Filled 2017-10-20 (×12): qty 1

## 2017-10-20 MED ORDER — BUPIVACAINE HCL (PF) 0.5 % IJ SOLN
INTRAMUSCULAR | Status: DC | PRN
  Administered 2017-10-20: 30 mL

## 2017-10-20 MED ORDER — DEXMEDETOMIDINE HCL IN NACL 200 MCG/50ML IV SOLN
INTRAVENOUS | Status: DC | PRN
  Administered 2017-10-20 (×2): 8 ug via INTRAVENOUS
  Administered 2017-10-20: 4 ug via INTRAVENOUS
  Administered 2017-10-20: 8 ug via INTRAVENOUS
  Administered 2017-10-20: 4 ug via INTRAVENOUS
  Administered 2017-10-20: 8 ug via INTRAVENOUS

## 2017-10-20 MED ORDER — CEFAZOLIN SODIUM-DEXTROSE 2-4 GM/100ML-% IV SOLN
2.0000 g | Freq: Three times a day (TID) | INTRAVENOUS | Status: AC
  Administered 2017-10-20 – 2017-10-21 (×2): 2 g via INTRAVENOUS
  Filled 2017-10-20 (×2): qty 100

## 2017-10-20 MED ORDER — FENTANYL 40 MCG/ML IV SOLN
INTRAVENOUS | Status: DC
  Administered 2017-10-20: 125 ug via INTRAVENOUS
  Administered 2017-10-20: 1000 ug via INTRAVENOUS
  Administered 2017-10-21: 30 ug via INTRAVENOUS
  Administered 2017-10-21: 1000 ug via INTRAVENOUS
  Administered 2017-10-22: 45 ug via INTRAVENOUS
  Administered 2017-10-22: 0 ug via INTRAVENOUS
  Administered 2017-10-22: 90 ug via INTRAVENOUS
  Administered 2017-10-22: 45 ug via INTRAVENOUS
  Administered 2017-10-22: 60 ug via INTRAVENOUS
  Administered 2017-10-22: 75 ug via INTRAVENOUS
  Administered 2017-10-23: 15 ug via INTRAVENOUS
  Administered 2017-10-23: 60 ug via INTRAVENOUS
  Filled 2017-10-20 (×2): qty 25

## 2017-10-20 MED ORDER — SODIUM CHLORIDE 0.9% FLUSH: 9.0000 mL | INTRAVENOUS | Status: DC | PRN

## 2017-10-20 MED ORDER — NALOXONE HCL 0.4 MG/ML IJ SOLN: 0.4000 mg | INTRAMUSCULAR | Status: DC | PRN

## 2017-10-20 MED ORDER — HYDRALAZINE HCL 20 MG/ML IJ SOLN: 10.0000 mg | INTRAMUSCULAR | Status: DC | PRN

## 2017-10-20 MED ORDER — POTASSIUM CHLORIDE 10 MEQ/50ML IV SOLN: 10.0000 meq | Freq: Every day | INTRAVENOUS | Status: DC | PRN

## 2017-10-20 MED ORDER — ROCURONIUM BROMIDE 50 MG/5ML IV SOLN
INTRAVENOUS | Status: AC
  Filled 2017-10-20: qty 1

## 2017-10-20 MED ORDER — LIDOCAINE 2% (20 MG/ML) 5 ML SYRINGE
INTRAMUSCULAR | Status: AC
  Filled 2017-10-20: qty 5

## 2017-10-20 MED ORDER — 0.9 % SODIUM CHLORIDE (POUR BTL) OPTIME
TOPICAL | Status: DC | PRN
  Administered 2017-10-20: 1000 mL

## 2017-10-20 MED ORDER — LACTATED RINGERS IV SOLN: INTRAVENOUS | Status: DC

## 2017-10-20 MED ORDER — BUPIVACAINE ON-Q PAIN PUMP (FOR ORDER SET NO CHG): INJECTION | Status: DC

## 2017-10-20 MED ORDER — LACTATED RINGERS IV SOLN
Freq: Once | INTRAVENOUS | Status: AC
  Administered 2017-10-20: 09:00:00 via INTRAVENOUS

## 2017-10-20 MED ORDER — SUGAMMADEX SODIUM 200 MG/2ML IV SOLN
INTRAVENOUS | Status: DC | PRN
  Administered 2017-10-20: 200 mg via INTRAVENOUS

## 2017-10-20 MED ORDER — CEFAZOLIN SODIUM-DEXTROSE 2-4 GM/100ML-% IV SOLN
2.0000 g | INTRAVENOUS | Status: AC
  Administered 2017-10-20: 2 g via INTRAVENOUS

## 2017-10-20 MED ORDER — MEPERIDINE HCL 50 MG/ML IJ SOLN: 6.2500 mg | INTRAMUSCULAR | Status: DC | PRN

## 2017-10-20 MED ORDER — KETOROLAC TROMETHAMINE 15 MG/ML IJ SOLN: 15.0000 mg | Freq: Three times a day (TID) | INTRAMUSCULAR | Status: DC

## 2017-10-20 MED ORDER — DEXAMETHASONE SODIUM PHOSPHATE 10 MG/ML IJ SOLN
INTRAMUSCULAR | Status: DC | PRN
  Administered 2017-10-20: 10 mg via INTRAVENOUS

## 2017-10-20 MED ORDER — ACETAMINOPHEN 160 MG/5ML PO SOLN: 1000.0000 mg | Freq: Four times a day (QID) | ORAL | Status: DC

## 2017-10-20 MED ORDER — FENTANYL CITRATE (PF) 250 MCG/5ML IJ SOLN
INTRAMUSCULAR | Status: DC | PRN
  Administered 2017-10-20: 100 ug via INTRAVENOUS
  Administered 2017-10-20: 25 ug via INTRAVENOUS
  Administered 2017-10-20 (×3): 50 ug via INTRAVENOUS
  Administered 2017-10-20: 150 ug via INTRAVENOUS
  Administered 2017-10-20: 75 ug via INTRAVENOUS

## 2017-10-20 MED ORDER — DEXTROSE-NACL 5-0.45 % IV SOLN
INTRAVENOUS | Status: DC
  Administered 2017-10-20: 21:00:00 via INTRAVENOUS

## 2017-10-20 MED ORDER — ROCURONIUM BROMIDE 10 MG/ML (PF) SYRINGE
PREFILLED_SYRINGE | INTRAVENOUS | Status: DC | PRN
  Administered 2017-10-20: 20 mg via INTRAVENOUS
  Administered 2017-10-20: 50 mg via INTRAVENOUS
  Administered 2017-10-20: 20 mg via INTRAVENOUS

## 2017-10-20 MED ORDER — ONDANSETRON HCL 4 MG/2ML IJ SOLN: 4.0000 mg | Freq: Four times a day (QID) | INTRAMUSCULAR | Status: DC | PRN

## 2017-10-20 MED ORDER — MIDAZOLAM HCL 5 MG/5ML IJ SOLN
INTRAMUSCULAR | Status: DC | PRN
  Administered 2017-10-20: 2 mg via INTRAVENOUS

## 2017-10-20 MED ORDER — HYDROMORPHONE HCL 2 MG/ML IJ SOLN
0.2500 mg | INTRAMUSCULAR | Status: DC | PRN
  Administered 2017-10-20: 1 mg via INTRAVENOUS
  Administered 2017-10-20: 0.5 mg via INTRAVENOUS

## 2017-10-20 MED ORDER — GLYCOPYRROLATE 0.2 MG/ML IJ SOLN
INTRAMUSCULAR | Status: DC | PRN
  Administered 2017-10-20 (×2): .1 mg via INTRAVENOUS

## 2017-10-20 MED ORDER — ONDANSETRON HCL 4 MG/2ML IJ SOLN
INTRAMUSCULAR | Status: AC
  Filled 2017-10-20: qty 2

## 2017-10-20 MED ORDER — ACETAMINOPHEN 500 MG PO TABS
1000.0000 mg | ORAL_TABLET | Freq: Four times a day (QID) | ORAL | Status: DC
  Administered 2017-10-20 – 2017-10-24 (×15): 1000 mg via ORAL
  Filled 2017-10-20 (×15): qty 2

## 2017-10-20 MED ORDER — PROPOFOL 10 MG/ML IV BOLUS
INTRAVENOUS | Status: AC
  Filled 2017-10-20: qty 40

## 2017-10-20 MED ORDER — OXYCODONE HCL 5 MG PO TABS
ORAL_TABLET | ORAL | Status: AC
  Filled 2017-10-20: qty 2

## 2017-10-20 MED ORDER — PROMETHAZINE HCL 25 MG/ML IJ SOLN
6.2500 mg | INTRAMUSCULAR | Status: DC | PRN
Start: 2017-10-20 — End: 2017-10-20

## 2017-10-20 MED ORDER — HYDROMORPHONE HCL 2 MG/ML IJ SOLN
INTRAMUSCULAR | Status: AC
  Filled 2017-10-20: qty 1

## 2017-10-20 MED ORDER — ONDANSETRON HCL 4 MG/2ML IJ SOLN
INTRAMUSCULAR | Status: DC | PRN
  Administered 2017-10-20: 4 mg via INTRAVENOUS

## 2017-10-20 MED ORDER — SODIUM CHLORIDE 0.9 % IV SOLN
INTRAVENOUS | Status: DC | PRN
  Administered 2017-10-20: 13:00:00 via INTRAVENOUS

## 2017-10-20 MED ORDER — FENTANYL CITRATE (PF) 100 MCG/2ML IJ SOLN: 25.0000 ug | INTRAMUSCULAR | Status: DC | PRN

## 2017-10-20 SURGICAL SUPPLY — 80 items
APPLIER CLIP 11 MED OPEN (CLIP) ×4
BAG DECANTER FOR FLEXI CONT (MISCELLANEOUS) IMPLANT
BLADE SURG 11 STRL SS (BLADE) ×4 IMPLANT
CANISTER SUCT 3000ML PPV (MISCELLANEOUS) ×4 IMPLANT
CATH KIT ON Q 5IN SLV (PAIN MANAGEMENT) ×4 IMPLANT
CATH ROBINSON RED A/P 22FR (CATHETERS) IMPLANT
CATH THORACIC 28FR (CATHETERS) IMPLANT
CATH THORACIC 36FR (CATHETERS) IMPLANT
CATH THORACIC 36FR RT ANG (CATHETERS) IMPLANT
CLIP APPLIE 11 MED OPEN (CLIP) ×2 IMPLANT
CLIP LIGATING EXTRA MED SLVR (CLIP) ×4 IMPLANT
CONN ST 1/4X3/8  BEN (MISCELLANEOUS) ×2
CONN ST 1/4X3/8 BEN (MISCELLANEOUS) ×2 IMPLANT
CONT SPEC 4OZ CLIKSEAL STRL BL (MISCELLANEOUS) ×8 IMPLANT
COVER SURGICAL LIGHT HANDLE (MISCELLANEOUS) ×8 IMPLANT
DERMABOND ADVANCED (GAUZE/BANDAGES/DRESSINGS)
DERMABOND ADVANCED .7 DNX12 (GAUZE/BANDAGES/DRESSINGS) IMPLANT
DRAIN CHANNEL 32F RND 10.7 FF (WOUND CARE) ×4 IMPLANT
DRAPE LAPAROSCOPIC ABDOMINAL (DRAPES) ×4 IMPLANT
DRAPE WARM FLUID 44X44 (DRAPE) ×4 IMPLANT
DRSG AQUACEL AG ADV 3.5X10 (GAUZE/BANDAGES/DRESSINGS) ×4 IMPLANT
ELECT BLADE 6.5 EXT (BLADE) ×8 IMPLANT
ELECT REM PT RETURN 9FT ADLT (ELECTROSURGICAL) ×4
ELECTRODE REM PT RTRN 9FT ADLT (ELECTROSURGICAL) ×2 IMPLANT
GAUZE SPONGE 4X4 12PLY STRL (GAUZE/BANDAGES/DRESSINGS) ×4 IMPLANT
GAUZE SPONGE 4X4 12PLY STRL LF (GAUZE/BANDAGES/DRESSINGS) ×4 IMPLANT
GLOVE BIO SURGEON STRL SZ7.5 (GLOVE) ×8 IMPLANT
GLOVE BIOGEL PI IND STRL 6.5 (GLOVE) ×4 IMPLANT
GLOVE BIOGEL PI IND STRL 8.5 (GLOVE) ×4 IMPLANT
GLOVE BIOGEL PI INDICATOR 6.5 (GLOVE) ×4
GLOVE BIOGEL PI INDICATOR 8.5 (GLOVE) ×4
GLOVE ECLIPSE 8.0 STRL XLNG CF (GLOVE) ×12 IMPLANT
GLOVE SURG SS PI 6.0 STRL IVOR (GLOVE) ×4 IMPLANT
GOWN STRL REUS W/ TWL LRG LVL3 (GOWN DISPOSABLE) ×6 IMPLANT
GOWN STRL REUS W/TWL LRG LVL3 (GOWN DISPOSABLE) ×6
KIT BASIN OR (CUSTOM PROCEDURE TRAY) ×4 IMPLANT
KIT SUCTION CATH 14FR (SUCTIONS) ×8 IMPLANT
KIT TURNOVER KIT B (KITS) ×4 IMPLANT
NS IRRIG 1000ML POUR BTL (IV SOLUTION) ×8 IMPLANT
PACK CHEST (CUSTOM PROCEDURE TRAY) ×4 IMPLANT
PAD ARMBOARD 7.5X6 YLW CONV (MISCELLANEOUS) ×8 IMPLANT
POWDER SURGICEL 3.0 GRAM (HEMOSTASIS) ×4 IMPLANT
SEALANT SURG COSEAL 4ML (VASCULAR PRODUCTS) IMPLANT
SOLUTION ANTI FOG 6CC (MISCELLANEOUS) ×4 IMPLANT
SPONGE INTESTINAL PEANUT (DISPOSABLE) ×4 IMPLANT
SPONGE LAP 18X18 X RAY DECT (DISPOSABLE) ×4 IMPLANT
SPONGE TONSIL 1 RF SGL (DISPOSABLE) ×8 IMPLANT
SUT CHROMIC 3 0 SH 27 (SUTURE) IMPLANT
SUT ETHILON 3 0 PS 1 (SUTURE) IMPLANT
SUT PROLENE 3 0 SH DA (SUTURE) IMPLANT
SUT PROLENE 4 0 RB 1 (SUTURE)
SUT PROLENE 4-0 RB1 .5 CRCL 36 (SUTURE) IMPLANT
SUT SILK  1 MH (SUTURE) ×6
SUT SILK 1 MH (SUTURE) ×6 IMPLANT
SUT SILK 2 0 SH CR/8 (SUTURE) ×4 IMPLANT
SUT SILK 2 0SH CR/8 30 (SUTURE) IMPLANT
SUT SILK 3 0SH CR/8 30 (SUTURE) IMPLANT
SUT VIC AB 1 CTX 18 (SUTURE) ×12 IMPLANT
SUT VIC AB 2 TP1 27 (SUTURE) IMPLANT
SUT VIC AB 2-0 CT2 18 VCP726D (SUTURE) IMPLANT
SUT VIC AB 2-0 CTX 36 (SUTURE) ×4 IMPLANT
SUT VIC AB 3-0 SH 18 (SUTURE) IMPLANT
SUT VIC AB 3-0 X1 27 (SUTURE) ×4 IMPLANT
SUT VICRYL 0 UR6 27IN ABS (SUTURE) IMPLANT
SUT VICRYL 2 TP 1 (SUTURE) ×4 IMPLANT
SWAB COLLECTION DEVICE MRSA (MISCELLANEOUS) IMPLANT
SWAB CULTURE ESWAB REG 1ML (MISCELLANEOUS) IMPLANT
SYSTEM SAHARA CHEST DRAIN ATS (WOUND CARE) ×4 IMPLANT
TAPE CLOTH SURG 4X10 WHT LF (GAUZE/BANDAGES/DRESSINGS) ×4 IMPLANT
TIP APPLICATOR SPRAY EXTEND 16 (VASCULAR PRODUCTS) IMPLANT
TOWEL GREEN STERILE (TOWEL DISPOSABLE) ×4 IMPLANT
TOWEL GREEN STERILE FF (TOWEL DISPOSABLE) ×4 IMPLANT
TRAP SPECIMEN MUCOUS 40CC (MISCELLANEOUS) IMPLANT
TRAY FOLEY MTR SLVR 16FR STAT (SET/KITS/TRAYS/PACK) ×4 IMPLANT
TROCAR XCEL NON-BLD 5MMX100MML (ENDOMECHANICALS) ×4 IMPLANT
TUBE CONNECTING 20'X1/4 (TUBING) ×1
TUBE CONNECTING 20X1/4 (TUBING) ×3 IMPLANT
TUNNELER SHEATH ON-Q 11GX8 DSP (PAIN MANAGEMENT) ×8 IMPLANT
WATER STERILE IRR 1000ML POUR (IV SOLUTION) ×8 IMPLANT
YANKAUER SUCT BULB TIP NO VENT (SUCTIONS) ×8 IMPLANT

## 2017-10-20 NOTE — Transfer of Care (Signed)
Immediate Anesthesia Transfer of Care Note  Patient: Jesse Rowe  Procedure(s) Performed: VIDEO ASSISTED THORACOSCOPY (Left Chest) RESECTION OF MEDIASTINAL MASS (N/A )  Patient Location: PACU  Anesthesia Type:General  Level of Consciousness: awake, alert , oriented and patient cooperative  Airway & Oxygen Therapy: Patient Spontanous Breathing and Patient connected to nasal cannula oxygen  Post-op Assessment: Report given to RN and Post -op Vital signs reviewed and stable  Post vital signs: Reviewed and stable  Last Vitals:  Vitals Value Taken Time  BP 126/78 10/20/2017  2:03 PM  Temp    Pulse 81 10/20/2017  2:06 PM  Resp 17 10/20/2017  2:06 PM  SpO2 100 % 10/20/2017  2:06 PM  Vitals shown include unvalidated device data.  Last Pain:  Vitals:   10/20/17 0906  TempSrc:   PainSc: 0-No pain      Patients Stated Pain Goal: 3 (75/44/92 0100)  Complications: No apparent anesthesia complications

## 2017-10-20 NOTE — Anesthesia Postprocedure Evaluation (Signed)
Anesthesia Post Note  Patient: Jesse Rowe  Procedure(s) Performed: VIDEO ASSISTED THORACOSCOPY (Left Chest) RESECTION OF MEDIASTINAL MASS (N/A )     Patient location during evaluation: PACU Anesthesia Type: General Level of consciousness: awake and alert Pain management: pain level controlled Vital Signs Assessment: post-procedure vital signs reviewed and stable Respiratory status: spontaneous breathing, nonlabored ventilation, respiratory function stable and patient connected to nasal cannula oxygen Cardiovascular status: blood pressure returned to baseline and stable Postop Assessment: no apparent nausea or vomiting Anesthetic complications: no    Last Vitals:  Vitals:   10/20/17 1615 10/20/17 1630  BP:    Pulse: (!) 59 77  Resp: 16 (!) 22  Temp:    SpO2: 100% 100%    Last Pain:  Vitals:   10/20/17 1700  TempSrc:   PainSc: Asleep                 Effie Berkshire

## 2017-10-20 NOTE — Anesthesia Procedure Notes (Signed)
Central Venous Catheter Insertion Performed by: Effie Berkshire, MD, anesthesiologist Start/End5/14/2019 9:30 AM, 10/20/2017 9:40 AM Patient location: Pre-op. Preanesthetic checklist: patient identified, IV checked, site marked, risks and benefits discussed, surgical consent, monitors and equipment checked, pre-op evaluation, timeout performed and anesthesia consent Position: Trendelenburg Lidocaine 1% used for infiltration and patient sedated Hand hygiene performed , maximum sterile barriers used  and Seldinger technique used Catheter size: 9 Fr Total catheter length 10. Central line was placed.Sheath introducer Swan type:thermodilution PA Cath depth:50 Procedure performed using ultrasound guided technique. Ultrasound Notes:anatomy identified, needle tip was noted to be adjacent to the nerve/plexus identified, no ultrasound evidence of intravascular and/or intraneural injection and image(s) printed for medical record Attempts: 1 Following insertion, line sutured and dressing applied. Post procedure assessment: blood return through all ports, free fluid flow and no air  Patient tolerated the procedure well with no immediate complications.

## 2017-10-20 NOTE — Progress Notes (Signed)
Pt transferred to 2H04. Pt and family oriented to room. Belongings taken home with family. A-line in place. Site assessed and intact. Incision site free from drainage/dressing intact. Chest tube to wall suction. Admission Complete. VSS. WCTM.

## 2017-10-20 NOTE — Anesthesia Procedure Notes (Addendum)
Procedure Name: Intubation Date/Time: 10/20/2017 11:09 AM Performed by: Renato Shin, CRNA Pre-anesthesia Checklist: Patient identified, Emergency Drugs available, Suction available and Patient being monitored Patient Re-evaluated:Patient Re-evaluated prior to induction Oxygen Delivery Method: Circle system utilized Preoxygenation: Pre-oxygenation with 100% oxygen Induction Type: IV induction Ventilation: Two handed mask ventilation required Laryngoscope Size: Mac and 4 Grade View: Grade I Endobronchial tube: Left, Double lumen EBT, EBT position confirmed by auscultation and EBT position confirmed by fiberoptic bronchoscope and 41 Fr Number of attempts: 1 Airway Equipment and Method: Rigid stylet Placement Confirmation: ETT inserted through vocal cords under direct vision,  positive ETCO2,  CO2 detector and breath sounds checked- equal and bilateral Tube secured with: Tape Dental Injury: Teeth and Oropharynx as per pre-operative assessment  Comments: Performed by Jena Gauss

## 2017-10-20 NOTE — Brief Op Note (Signed)
10/20/2017  1:36 PM  PATIENT:  Jesse Rowe  42 y.o. male  PRE-OPERATIVE DIAGNOSIS:  MEDIASTINAL MASS  POST-OPERATIVE DIAGNOSIS:  MEDIASTINAL MASS  PROCEDURE:  Procedure(s): VIDEO ASSISTED THORACOSCOPY (Left) RESECTION OF MEDIASTINAL MASS (N/A)  SURGEON:  Surgeon(s) and Role:    Ivin Poot, MD - Primary    * Bartle, Fernande Boyden, MD - Assisting  PHYSICIAN ASSISTANT:  Nicholes Rough, PA-C   ANESTHESIA:   general  EBL:  200 mL   BLOOD ADMINISTERED:none  DRAINS: one straight blake drain   LOCAL MEDICATIONS USED:  BUPIVICAINE   SPECIMEN:  Mediastinal mass  DISPOSITION OF SPECIMEN:  PATHOLOGY  COUNTS:  YES  DICTATION: .Dragon Dictation  PLAN OF CARE: Admit to inpatient   PATIENT DISPOSITION:  ICU - intubated and hemodynamically stable.   Delay start of Pharmacological VTE agent (>24hrs) due to surgical blood loss or risk of bleeding: yes

## 2017-10-20 NOTE — Op Note (Signed)
NAMECHILTON, SALLADE MEDICAL RECORD ZO:[id number] ACCOUNT [id number] DATE OF BIRTH:02/07/1976 FACILITY: [facility] LOCATION: [facility] PHYSICIAN:Jonuel Butterfield VAN TRIGT III, MD  OPERATIVE REPORT  DATE OF PROCEDURE:  10/20/2017  OPERATION: 1.  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 2.  Placement of On-Q wound analgesia irrigation system.  SURGEON:  Len Childs, MD  ASSISTANTS:  Nicholes Rough, PA-C, and Tiana Loft, MD  ANESTHESIA:  General.  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  DESCRIPTION OF PROCEDURE:  The patient was brought to the preoperative holding area after informed consent was documented and the final review of the medical record and documentation of correct site was performed.  The patient was placed supine on the  operating table, and general anesthesia was induced with a double-lumen endotracheal tube.  The patient was then turned left side up, and the left chest was prepped and draped as a sterile field.  A proper time-out was performed.  A small incision was made in the 7th interspace close to the tip of the scapula, and the camera was inserted.  The lung was not fully deflated, and visualization of the paraspinal mass was suboptimal.  The camera was withdrawn.  A small incision was made  anterior to the trapezius muscle in the 5th interspace.  The ribs were gently spread with a small retractor.  The lungs were retracted, and the mass was visible.  An incision was made in the mediastinal pleura at the base of the mass, and this was carefully carried to the tissue plane between the descending thoracic aorta and the paraspinal tumor.  The tumor did not invade the aorta and dissected from the aorta  with minimal  difficulty.  The tumor, however, was very firmly adherent to the spinal column, and with great care and better exposure, the tumor was resected en bloc.  The intercostal nerve from which the tumor arose was identified, and this was doubly clipped and divided outside  the neural foramen.  The tumor was removed and sent for frozen section, which returned as benign tumor, spindle cell type, with further pathology pending.  The bed of the tumor mass in the chest wall was irrigated and hemostasis was achieved.  A soft Bard catheter was placed in the paraspinal gutter and brought out through a separate incision.  Pericostal sutures were placed (2).  The lung was reexpanded,  and then the pericostal sutures were tied.  The muscle layer was closed with interrupted Vicryl, and subcutaneous and skin layers were closed using running Vicryl.  The chest tube was connected to underwater drainage reservoir, and the patient was turned  supine, extubated, and returned to recovery room in stable condition.  LN/NUANCE  D:10/20/2017 T:10/20/2017 JOB:[id number]

## 2017-10-20 NOTE — Anesthesia Preprocedure Evaluation (Signed)
Anesthesia Evaluation  Patient identified by MRN, date of birth, ID band Patient awake    Reviewed: Allergy & Precautions, NPO status , Patient's Chart, lab work & pertinent test results  Airway Mallampati: III  TM Distance: >3 FB Neck ROM: Full    Dental  (+) Teeth Intact, Dental Advisory Given   Pulmonary asthma ,    breath sounds clear to auscultation       Cardiovascular negative cardio ROS   Rhythm:Regular Rate:Normal     Neuro/Psych negative neurological ROS  negative psych ROS   GI/Hepatic negative GI ROS, Neg liver ROS,   Endo/Other  negative endocrine ROS  Renal/GU negative Renal ROS     Musculoskeletal negative musculoskeletal ROS (+)   Abdominal Normal abdominal exam  (+)   Peds  Hematology negative hematology ROS (+)   Anesthesia Other Findings   Reproductive/Obstetrics                             Lab Results  Component Value Date   WBC 9.3 10/19/2017   HGB 14.5 10/19/2017   HCT 42.9 10/19/2017   MCV 88.5 10/19/2017   PLT 187 10/19/2017   Lab Results  Component Value Date   CREATININE 1.12 10/19/2017   BUN 13 10/19/2017   NA 140 10/19/2017   K 3.5 10/19/2017   CL 109 10/19/2017   CO2 21 (L) 10/19/2017   Lab Results  Component Value Date   INR 1.03 10/19/2017   EKG: normal sinus rhythm.   Anesthesia Physical Anesthesia Plan  ASA: II  Anesthesia Plan: General   Post-op Pain Management:    Induction: Intravenous  PONV Risk Score and Plan: 3 and Ondansetron, Dexamethasone and Midazolam  Airway Management Planned: Double Lumen EBT  Additional Equipment: Arterial line and CVP  Intra-op Plan:   Post-operative Plan: Extubation in OR  Informed Consent: I have reviewed the patients History and Physical, chart, labs and discussed the procedure including the risks, benefits and alternatives for the proposed anesthesia with the patient or authorized  representative who has indicated his/her understanding and acceptance.   Dental advisory given  Plan Discussed with: CRNA  Anesthesia Plan Comments:         Anesthesia Quick Evaluation

## 2017-10-20 NOTE — H&P (Signed)
PCP is Seward Carol, MD Referring Provider is No ref. provider found  Patient examined and images of chest CT scan and thoracic MRI personally reviewed, demonstrated to patient, and counseled with patient.  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  He has had symptoms of chest and mid back pain and was evaluated at the emergency department.  Cardiac enzymes were negative.  EKG was normal.  Chest x-ray showed a posterior thoracic mass and a CT angiogram showed no evidence of pulmonary embolus with a 4.2 cm smooth round mass adjacent to the spine at the level of T7 and also adjacent to the aorta.  MRI with contrast shows no evidence of invasion with extension into the neuroforamina.  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  Patient's general health has been good.  He is currently taking antibiotics for sinusitis.  He just started on Monday.  He has had nasal drainage with congestion but no productive cough.  No fever. He denies any previous thoracic trauma pneumothorax or rib fractures. The patient is left-hand dominant.  There is no history of neurofibromatosis in the family.   Past Medical History:  Diagnosis Date  . Asthma   . History of multiple allergies   . Mediastinal mass     History reviewed. No pertinent surgical history.  Family History  Problem Relation Age of Onset  . Colon cancer Mother   . CVA Mother   . Diabetes Mother   . Hypertension Father   . Hyperlipidemia Father     Social History Social History   Tobacco Use  . Smoking status: Never Smoker  . Smokeless tobacco: Never Used  Substance Use Topics  . Alcohol use: Yes    Frequency: Never    Comment: occasional  . Drug use: Never    Current Facility-Administered Medications  Medication Dose Route Frequency Provider Last Rate Last Dose  . ceFAZolin (ANCEF) IVPB 2g/100 mL premix  2 g Intravenous To  SS-Surg [place] Gum, Collier Salina, MD        No Known Allergies  Review of Systems         Review of Systems :  [ y ] = yes, [  ] = no        General :  Weight gain [   ]    Weight loss  [   ]  Fatigue [  ]  Fever [  ]  Chills  [  ]                                Weakness  [  ]           HEENT    Headache [  ]  Dizziness [  ]  Blurred vision [  ] Glaucoma  [  ]                          Nosebleeds [  ] Painful or loose teeth [  ]        Cardiac :  Chest pain/ pressure [ y ]  Resting SOB [  ] exertional SOB [  ]                        Orthopnea [  ]  Pedal  edema  [  ]  Palpitations [  ] Syncope/presyncope [ ]                         Paroxysmal nocturnal dyspnea [  ]         Pulmonary : cough [  ]  wheezing [  ]  Hemoptysis [  ] Sputum [  ] Snoring Blue.Reese  ]                              Pneumothorax [  ]  Sleep apnea [  ]        GI : Vomiting [  ]  Dysphagia [  ]  Melena  [  ]  Abdominal pain [  ] BRBPR [  ]              Heart burn [  ]  Constipation [  ] Diarrhea  [  ] Colonoscopy [   ]        GU : Hematuria [  ]  Dysuria [  ]  Nocturia [  ] UTI's [  ]        Vascular : Claudication [  ]  Rest pain [  ]  DVT [  ] Vein stripping [  ] leg ulcers [  ]                          TIA [  ] Stroke [  ]  Varicose veins [  ]        NEURO :  Headaches  [  ] Seizures [  ] Vision changes [  ] Paresthesias [  ]                                       Seizures [  ] left hand dominant        Musculoskeletal :  Arthritis [  ] Gout  [  ]  Back pain [ y ]  Joint pain [  ]        Skin :  Rash [  ]  Melanoma [  ] Sores [  ]        Heme : Bleeding problems [  ]Clotting Disorders [  ] Anemia [  ]Blood Transfusion [ ]         Endocrine : Diabetes [  ] Heat or Cold intolerance [  ] Polyuria [  ]excessive thirst [ ]         Psych : Depression [  ]  Anxiety [  ]  Psych hospitalizations [  ] Memory change [  ]                                               BP (!) 141/69   Pulse 72   Temp 98.3 F (36.8 C) (Oral)    Resp 20   Ht 5\' 10"  (1.778 m)   Wt 218 lb (98.9 kg)   SpO2 99%   BMI 31.28 kg/m  Physical Exam        Exam    General- alert and comfortable.  Heavyset middle-aged male no  acute distress    Neck- no JVD, no cervical adenopathy palpable, no carotid bruit   Lungs- clear without rales, wheezes   Cor- regular rate and rhythm, no murmur , gallop   Abdomen- soft, non-tender   Extremities - warm, non-tender, minimal edema   Neuro- oriented, appropriate, no focal weakness    Diagnostic Tests: CT scan of chest and thoracic spine MRA images demonstrate the 4.2 cm round ovoid density at the level of T6-T7 consistent with schwannoma  Impression: I recommended surgical resection the patient.  He understands that this is probably not a malignant tumor but a tumor that will continue to increase in size and cause symptoms.  Prior to scheduling surgery I reviewed the MRI images with neurosurgery Dr Vertell Limber for coordination of care. The tumor does not extend into the spinal canal and will not need spine exposure  PFTS are satisfactory for thoracotomy  Len Childs, MD [facility] [phone]

## 2017-10-20 NOTE — Anesthesia Procedure Notes (Signed)
Arterial Line Insertion Start/End5/14/2019 10:30 AM, 10/20/2017 10:40 AM Performed by: CRNA  Patient location: Pre-op. Lidocaine 1% used for infiltration and patient sedated Left, radial was placed Catheter size: 20 G Hand hygiene performed , maximum sterile barriers used  and Seldinger technique used Allen's test indicative of satisfactory collateral circulation Attempts: 1 Procedure performed without using ultrasound guided technique. Following insertion, dressing applied and Biopatch. Post procedure assessment: normal  Patient tolerated the procedure well with no immediate complications.

## 2017-10-20 NOTE — Progress Notes (Signed)
Pre Procedure note for inpatients:   Jesse Rowe has been scheduled for Procedure(s): VIDEO ASSISTED THORACOSCOPY (Left) RESECTION OF MEDIASTINAL MASS (N/A) today. The various methods of treatment have been discussed with the patient. After consideration of the risks, benefits and treatment options the patient has consented to the planned procedure.   The patient has been seen and labs reviewed. There are no changes in the patient's condition to prevent proceeding with the planned procedure today.  Recent labs:  Lab Results  Component Value Date   WBC 9.3 10/19/2017   HGB 14.5 10/19/2017   HCT 42.9 10/19/2017   PLT 187 10/19/2017   GLUCOSE 120 (H) 10/19/2017   ALT 23 10/19/2017   AST 28 10/19/2017   NA 140 10/19/2017   K 3.5 10/19/2017   CL 109 10/19/2017   CREATININE 1.12 10/19/2017   BUN 13 10/19/2017   CO2 21 (L) 10/19/2017   INR 1.03 10/19/2017    Len Childs, MD 10/20/2017 10:22 AM

## 2017-10-21 ENCOUNTER — Encounter (HOSPITAL_COMMUNITY): Payer: Self-pay | Admitting: Cardiothoracic Surgery

## 2017-10-21 ENCOUNTER — Inpatient Hospital Stay (HOSPITAL_COMMUNITY): Payer: BC Managed Care – PPO

## 2017-10-21 LAB — BASIC METABOLIC PANEL
Anion gap: 5 (ref 5–15)
BUN: 10 mg/dL (ref 6–20)
CO2: 29 mmol/L (ref 22–32)
Calcium: 8.9 mg/dL (ref 8.9–10.3)
Chloride: 105 mmol/L (ref 101–111)
Creatinine, Ser: 0.94 mg/dL (ref 0.61–1.24)
GFR calc Af Amer: >60 mL/min (ref >60)
GFR calc non Af Amer: >60 mL/min (ref >60)
Glucose, Bld: 108 mg/dL — ABNORMAL HIGH (ref 65–99)
Potassium: 3.8 mmol/L (ref 3.5–5.1)
Sodium: 139 mmol/L (ref 135–145)

## 2017-10-21 LAB — GLUCOSE, CAPILLARY
Glucose-Capillary: 103 mg/dL — ABNORMAL HIGH (ref 65–99)
Glucose-Capillary: 106 mg/dL — ABNORMAL HIGH (ref 65–99)
Glucose-Capillary: 129 mg/dL — ABNORMAL HIGH (ref 65–99)
Glucose-Capillary: 137 mg/dL — ABNORMAL HIGH (ref 65–99)
Glucose-Capillary: 76 mg/dL (ref 65–99)
Glucose-Capillary: 96 mg/dL (ref 65–99)

## 2017-10-21 LAB — BLOOD GAS, ARTERIAL
Acid-Base Excess: 3 mmol/L — ABNORMAL HIGH (ref 0.0–2.0)
Bicarbonate: 26.6 mmol/L (ref 20.0–28.0)
DRAWN BY: 345601
O2 Content: 2 L/min
O2 SAT: 98.9 %
PCO2 ART: 37.5 mmHg (ref 32.0–48.0)
PH ART: 7.464 — AB (ref 7.350–7.450)
Patient temperature: 98.6
pO2, Arterial: 153 mmHg — ABNORMAL HIGH (ref 83.0–108.0)

## 2017-10-21 LAB — CBC
HCT: 38 % — ABNORMAL LOW (ref 39.0–52.0)
Hemoglobin: 12.7 g/dL — ABNORMAL LOW (ref 13.0–17.0)
MCH: 30 pg (ref 26.0–34.0)
MCHC: 33.4 g/dL (ref 30.0–36.0)
MCV: 89.6 fL (ref 78.0–100.0)
Platelets: 195 10*3/uL (ref 150–400)
RBC: 4.24 MIL/uL (ref 4.22–5.81)
RDW: 11.9 % (ref 11.5–15.5)
WBC: 12 10*3/uL — ABNORMAL HIGH (ref 4.0–10.5)

## 2017-10-21 MED ORDER — DEXTROSE-NACL 5-0.45 % IV SOLN: INTRAVENOUS | Status: DC

## 2017-10-21 MED FILL — Sodium Chloride IV Soln 0.9%: INTRAVENOUS | Qty: 1000 | Status: AC

## 2017-10-21 MED FILL — Heparin Sodium (Porcine) Inj 1000 Unit/ML: INTRAMUSCULAR | Qty: 30 | Status: AC

## 2017-10-21 NOTE — Care Management Note (Signed)
Case Management Note Marvetta Gibbons RN,BSN Unit [facility]22 Case Manager  [phone]  Patient Details  Name: Jesse Rowe MRN: [id number] Date of Birth: 27-Apr-1976  Subjective/Objective:  Pt admitted s/p VATS -mini thoracotomy- mass resection                 Action/Plan: PTA pt lived at home with spouse- independent- anticipate return home- CM to follow for transition of care needs  Expected Discharge Date:                  Expected Discharge Plan:  Home/Self Care  In-House Referral:     Discharge planning Services  CM Consult  Post Acute Care Choice:    Choice offered to:     DME Arranged:    DME Agency:     HH Arranged:    HH Agency:     Status of Service:  In process, will continue to follow  If discussed at Long Length of Stay Meetings, dates discussed:    Discharge Disposition:   Additional Comments:  Dawayne Patricia, RN 10/21/2017, 10:44 AM

## 2017-10-21 NOTE — Plan of Care (Signed)
  Problem: Education: Goal: Knowledge of General Education information will improve Outcome: Progressing   Problem: Education: Goal: Knowledge of General Education information will improve Outcome: Progressing   Problem: Clinical Measurements: Goal: Respiratory complications will improve Outcome: Progressing Goal: Cardiovascular complication will be avoided Outcome: Progressing   Problem: Activity: Goal: Risk for activity intolerance will decrease Outcome: Progressing   Problem: Nutrition: Goal: Adequate nutrition will be maintained Outcome: Progressing   Problem: Coping: Goal: Level of anxiety will decrease Outcome: Progressing

## 2017-10-21 NOTE — Progress Notes (Signed)
[facility] BRIEF SICU PROGRESS NOTE  1 Day Post-Op  S/P Procedure(s) (LRB): VIDEO ASSISTED THORACOSCOPY (Left) RESECTION OF MEDIASTINAL MASS (N/A)   Stable day Adequate pain control Breathing comfortably NSR w/ stable BP No air leak  Plan: Continue current plan  Rexene Alberts, MD 10/21/2017 7:35 PM

## 2017-10-22 ENCOUNTER — Inpatient Hospital Stay (HOSPITAL_COMMUNITY): Payer: BC Managed Care – PPO

## 2017-10-22 LAB — GLUCOSE, CAPILLARY
Glucose-Capillary: 76 mg/dL (ref 65–99)
Glucose-Capillary: 77 mg/dL (ref 65–99)
Glucose-Capillary: 85 mg/dL (ref 65–99)
Glucose-Capillary: 92 mg/dL (ref 65–99)
Glucose-Capillary: 93 mg/dL (ref 65–99)
Glucose-Capillary: 93 mg/dL (ref 65–99)

## 2017-10-22 LAB — COMPREHENSIVE METABOLIC PANEL
ALBUMIN: 3.3 g/dL — AB (ref 3.5–5.0)
ALK PHOS: 38 U/L (ref 38–126)
ALT: 15 U/L — ABNORMAL LOW (ref 17–63)
AST: 31 U/L (ref 15–41)
Anion gap: 8 (ref 5–15)
BUN: 12 mg/dL (ref 6–20)
CALCIUM: 8.7 mg/dL — AB (ref 8.9–10.3)
CO2: 30 mmol/L (ref 22–32)
Chloride: 103 mmol/L (ref 101–111)
Creatinine, Ser: 1.15 mg/dL (ref 0.61–1.24)
GFR calc non Af Amer: >60 mL/min (ref >60)
GLUCOSE: 108 mg/dL — AB (ref 65–99)
POTASSIUM: 3.7 mmol/L (ref 3.5–5.1)
SODIUM: 141 mmol/L (ref 135–145)
TOTAL PROTEIN: 5.9 g/dL — AB (ref 6.5–8.1)
Total Bilirubin: 0.7 mg/dL (ref 0.3–1.2)

## 2017-10-22 LAB — CBC
HCT: 39.8 % (ref 39.0–52.0)
HEMOGLOBIN: 13 g/dL (ref 13.0–17.0)
MCH: 29.3 pg (ref 26.0–34.0)
MCHC: 32.7 g/dL (ref 30.0–36.0)
MCV: 89.8 fL (ref 78.0–100.0)
Platelets: 193 10*3/uL (ref 150–400)
RBC: 4.43 MIL/uL (ref 4.22–5.81)
RDW: 11.9 % (ref 11.5–15.5)
WBC: 10.5 10*3/uL (ref 4.0–10.5)

## 2017-10-22 NOTE — Progress Notes (Addendum)
      [address]       [place] [address]             [phone]      2 Days Post-Op Procedure(s) (LRB): VIDEO ASSISTED THORACOSCOPY (Left) RESECTION OF MEDIASTINAL MASS (N/A) Subjective: Feels okay this morning.   Objective: Vital signs in last 24 hours: Temp:  [97.4 F (36.3 C)-98.8 F (37.1 C)] 97.4 F (36.3 C) (05/16 0500) Pulse Rate:  [56-91] 64 (05/16 0700) Cardiac Rhythm: Normal sinus rhythm (05/16 0425) Resp:  [14-27] 16 (05/16 0700) BP: (96-147)/(64-90) 140/81 (05/16 0700) SpO2:  [92 %-100 %] 98 % (05/16 0700) Arterial Line BP: (145-147)/(60-65) 147/65 (05/15 0900)     Intake/Output from previous day: 05/15 0701 - 05/16 0700 In: 590 [P.O.:50; I.V.:540] Out: 2515 [Urine:2325; Chest Tube:190] Intake/Output this shift: No intake/output data recorded.  General appearance: alert, cooperative and no distress Heart: regular rate and rhythm, S1, S2 normal, no murmur, click, rub or gallop Lungs: clear to auscultation bilaterally Abdomen: soft, non-tender; bowel sounds normal; no masses,  no organomegaly Extremities: extremities normal, atraumatic, no cyanosis or edema Wound: clean and dry  Lab Results: Recent Labs    10/21/17 0952 10/22/17 0354  WBC 12.0* 10.5  HGB 12.7* 13.0  HCT 38.0* 39.8  PLT 195 193   BMET:  Recent Labs    10/21/17 0952 10/22/17 0354  NA 139 141  K 3.8 3.7  CL 105 103  CO2 29 30  GLUCOSE 108* 108*  BUN 10 12  CREATININE 0.94 1.15  CALCIUM 8.9 8.7*    PT/INR:  Recent Labs    10/19/17 1012  LABPROT 13.4  INR 1.03   ABG    Component Value Date/Time   PHART 7.464 (H) 10/21/2017 0350   HCO3 26.6 10/21/2017 0350   O2SAT 98.9 10/21/2017 0350   CBG (last 3)  Recent Labs    10/21/17 1948 10/22/17 0041 10/22/17 0445  GLUCAP 76 85 92    Assessment/Plan: S/P Procedure(s) (LRB): VIDEO ASSISTED THORACOSCOPY (Left) RESECTION OF MEDIASTINAL MASS (N/A)  1. CV-NSR in the 60s. BP well controlled.    2. Pulm-CT put out 159ml/24 hours. CXR stable without pneumothorax. No air leak. Tolerating room air with good oxygen saturation.  3. Renal-creatinine 1.15, electrolytes okay 4. H and H stable, expected acute blood loss anemia 5. Platelets stable 6. Blood glucose well controlled  Plan: Chest tube out today. Afterwards d/c PCA pump. Pain is well controlled on current regimen. Possibly to the floor later today. Stepdown orders already placed.     LOS: 2 days    Elgie Collard 10/22/2017   Progressing well patient examined and medical record reviewed,agree with above note. Tharon Aquas Trigt III 10/22/2017

## 2017-10-23 ENCOUNTER — Inpatient Hospital Stay (HOSPITAL_COMMUNITY): Payer: BC Managed Care – PPO

## 2017-10-23 LAB — TYPE AND SCREEN
ABO/RH(D): AB POS
Antibody Screen: NEGATIVE
Unit division: 0
Unit division: 0

## 2017-10-23 LAB — GLUCOSE, CAPILLARY
Glucose-Capillary: 91 mg/dL (ref 65–99)
Glucose-Capillary: 85 mg/dL (ref 65–99)

## 2017-10-23 LAB — BPAM RBC
Blood Product Expiration Date: 201906042359
Blood Product Expiration Date: 201906042359
ISSUE DATE / TIME: 201905141213
ISSUE DATE / TIME: 201905141213
Unit Type and Rh: 8400
Unit Type and Rh: 8400

## 2017-10-23 NOTE — Discharge Summary (Addendum)
Physician Discharge Summary  Patient ID: Jesse Rowe MRN: [id number] DOB/AGE: May 16, 1976 42 y.o.  Admit date: 10/20/2017 Discharge date: 10/24/2017  Admission Diagnoses:  Patient Active Problem List   Diagnosis Date Noted  . Mediastinal mass    Discharge Diagnoses:   Patient Active Problem List   Diagnosis Date Noted  . S/P thoracotomy 10/20/2017  . Mediastinal mass    Discharged Condition: good  History of Present Illness:  Jesse Rowe is a 42 yo male who presented to the ED with complaints of chest and mid back pain.  Workup showed normal EKG and negative cardiac enzymes.  CXR obtained was abnormal showing a posterior thoracic mass.  Further workup with CT of the chest was negative for pulmonary embolism but did confirm the presence of a 4.2 cm smooth round mass adjacent to the spine at the level of T7 and the aorta.  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  He was referred to [facility] for surgical resection.  He was evaluated by Dr. Prescott Gum at which time the patient states he has been in his normal state of health.  He was currently on antibiotics for sinus infection.  It was felt surgical resection would be indicated.  This would be done through a thoracotomy approach.  The risks and benefits of the procedure were explained to the patient and he was agreeable to proceed.    Hospital Course:   Jesse Rowe presented to [facility] hospital on 10/20/2017.  He was taken to the operating room and underwent Left Mini Thoracotomy with resection of the left paraspinal mass.  He tolerated the procedure without difficulty, was extubated, and taken to the SICU in stable condition.  The patient did well post operatively.  His chest tubes and arterial lines were removed without difficulty.  Follow up CXR showed development of apical pneumothorax.  This has remained stable of subsequent CXR.  The patient is ambulating without difficulty.  His pain is  well controlled.  His incision is healing without evidence of infection.  He is tolerating a regular diet.  He is medically stable for discharge home today.   Significant Diagnostic Studies: radiology:   MRI:  4 x 2.8 x 4 cm well-circumscribed left paravertebral mass shows diffuse enhancement with a mildly heterogeneous nature. 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  Pathology:  Mediastinum, mass resection, Posterior - SCHWANNOMA.  Treatments: surgery:   1.  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 2.  Placement of On-Q wound analgesia irrigation system.  Discharge Exam: Blood pressure (!) 141/80, pulse 78, temperature 98.7 F (37.1 C), temperature source Oral, resp. rate (!) 21, height 5\' 10"  (1.778 m), weight 218 lb (98.9 kg), SpO2 100 %.   General appearance: alert and cooperative Neurologic: intact Heart: regular rate and rhythm, S1, S2 normal, no murmur, click, rub or gallop Lungs: clear to auscultation bilaterally Wound: Incision and chest tube sites are intact     Disposition: Discharge disposition: 01-Home or Self Care        Allergies as of 10/24/2017   No Known Allergies     Medication List    STOP taking these medications   HYDROcodone-acetaminophen 5-325 MG tablet Commonly known as:  NORCO/VICODIN   naproxen 500 MG tablet Commonly known as:  NAPROSYN   traMADol 50 MG tablet Commonly known as:  ULTRAM     TAKE these medications   acetaminophen  500 MG tablet Commonly known as:  TYLENOL Take 2 tablets (1,000 mg total) by mouth every 6 (six) hours.   albuterol 108 (90 Base) MCG/ACT inhaler Commonly known as:  PROVENTIL HFA;VENTOLIN HFA Inhale 2 puffs into the lungs every 6 (six) hours as needed. For shortness of breath.   azelastine 0.1 % nasal spray Commonly known as:  ASTELIN Place 1 spray into both nostrils every other day. Alternate  with rhinocort   cetirizine 10 MG tablet Commonly known as:  ZYRTEC Take 10 mg by mouth daily.   EPIPEN 2-PAK IJ Inject 1 Syringe as directed as needed. For allergic reaction   montelukast 10 MG tablet Commonly known as:  SINGULAIR Take 10 mg by mouth at bedtime as needed (wheezing).   multivitamin with minerals Tabs tablet Take 1 tablet by mouth daily.   oxyCODONE 5 MG immediate release tablet Commonly known as:  Oxy IR/ROXICODONE 1-2 tabs q 4 hours as needed for pain.   RHINOCORT ALLERGY 32 MCG/ACT nasal spray Generic drug:  budesonide Place 1 spray into both nostrils every other day. Alternate with azelastine      Follow-up Information    Ivin Poot, MD Follow up on 11/11/2017.   Specialty:  Cardiothoracic Surgery Why:  Appointment is at 10:30, please get CXR at 10:00 at [facility] located on first floor of our office building Contact information: [address] [place] [address] [phone]        Seward Carol, MD. Call in 1 day(s).   Specialty:  Internal Medicine Contact information: [address] [place] [address] [phone]           Signed: Elgie Collard 10/24/2017, 11:08 AM

## 2017-10-23 NOTE — Discharge Instructions (Signed)
Discharge Instructions:  1. You may shower, please wash incisions daily with soap and water and keep dry.  If you wish to cover wounds with dressing you may do so but please keep clean and change daily.  No tub baths or swimming until incisions have completely healed.  If your incisions become red or develop any drainage please call our office at [phone]  2. No Driving until cleared by Dr. Lucianne Lei Trigt's office and you are no longer using narcotic pain medications  3. Fever of 101.5 for at least 24 hours with no source, please contact our office at [phone]  4. Activity- up as tolerated, please walk at least 3 times per day.  Avoid strenuous activity for several weeks  5. If any questions or concerns arise, please do not hesitate to contact our office at [phone]

## 2017-10-23 NOTE — Plan of Care (Signed)
Continue current care plan 

## 2017-10-23 NOTE — Progress Notes (Addendum)
      [address]       [place] [address]             [phone]      3 Days Post-Op Procedure(s) (LRB): VIDEO ASSISTED THORACOSCOPY (Left) RESECTION OF MEDIASTINAL MASS (N/A) Subjective: Feels okay this morning. Plans to walk more today.   Objective: Vital signs in last 24 hours: Temp:  [97.7 F (36.5 C)-98.8 F (37.1 C)] 98.2 F (36.8 C) (05/17 0700) Pulse Rate:  [66-85] 85 (05/17 0700) Cardiac Rhythm: Normal sinus rhythm (05/17 0415) Resp:  [12-22] 22 (05/17 0700) BP: (128-144)/(76-89) 136/89 (05/17 0700) SpO2:  [96 %-100 %] 100 % (05/17 0700)     Intake/Output from previous day: 05/16 0701 - 05/17 0700 In: 890 [P.O.:440; I.V.:450] Out: 250 [Urine:250] Intake/Output this shift: No intake/output data recorded.  General appearance: alert, cooperative and no distress Heart: regular rate and rhythm, S1, S2 normal, no murmur, click, rub or gallop Lungs: clear to auscultation bilaterally Abdomen: soft, non-tender; bowel sounds normal; no masses,  no organomegaly Extremities: extremities normal, atraumatic, no cyanosis or edema Wound: clean and dry  Lab Results: Recent Labs    10/21/17 0952 10/22/17 0354  WBC 12.0* 10.5  HGB 12.7* 13.0  HCT 38.0* 39.8  PLT 195 193   BMET:  Recent Labs    10/21/17 0952 10/22/17 0354  NA 139 141  K 3.8 3.7  CL 105 103  CO2 29 30  GLUCOSE 108* 108*  BUN 10 12  CREATININE 0.94 1.15  CALCIUM 8.9 8.7*    PT/INR: No results for input(s): LABPROT, INR in the last 72 hours. ABG    Component Value Date/Time   PHART 7.464 (H) 10/21/2017 0350   HCO3 26.6 10/21/2017 0350   O2SAT 98.9 10/21/2017 0350   CBG (last 3)  Recent Labs    10/22/17 1608 10/22/17 2052 10/23/17 0440  GLUCAP 77 93 91    Assessment/Plan: S/P Procedure(s) (LRB): VIDEO ASSISTED THORACOSCOPY (Left) RESECTION OF MEDIASTINAL MASS (N/A)   1. CV-NSR in the 80s. BP well controlled.  2. Pulm-CT pulled yesterday. CXR stable today.  Tolerating room air with good oxygen saturation.  3. Renal-creatinine 1.15, electrolytes okay 4. H and H stable, expected acute blood loss anemia 5. Platelets stable 6. Blood glucose well controlled  Plan: Discontinue On-Q and PCA. Work on ambulation. Encouraged use of incentive spirometer. Possibly home in 1-2 days.     LOS: 3 days    Elgie Collard 10/23/2017  On room air CXR clear Home over weekend patient examined and medical record reviewed,agree with above note. Tharon Aquas Trigt III 10/23/2017

## 2017-10-24 MED ORDER — OXYCODONE HCL 5 MG PO TABS: ORAL_TABLET | ORAL | 0 refills | Status: DC

## 2017-10-24 MED ORDER — ACETAMINOPHEN 500 MG PO TABS: 1000.0000 mg | ORAL_TABLET | Freq: Four times a day (QID) | ORAL | 0 refills | Status: AC

## 2017-10-24 MED ORDER — OXYCODONE HCL 5 MG PO TABS: 5.0000 mg | ORAL_TABLET | Freq: Four times a day (QID) | ORAL | 0 refills | Status: DC | PRN

## 2017-10-24 NOTE — Plan of Care (Signed)
Pt ready to go home. Will continue to monitor.

## 2017-10-24 NOTE — Progress Notes (Signed)
Explained and discussed discharge instructions, pt going home via w/c with belongings and family. Prescription given to family. No complaints at this time

## 2017-10-24 NOTE — Progress Notes (Signed)
Patient ID: Jesse Rowe, male   DOB: [birth year], 42 y.o.   MRN: [id number]      [address]       [place] [address]             [phone]                 4 Days Post-Op Procedure(s) (LRB): VIDEO ASSISTED THORACOSCOPY (Left) RESECTION OF MEDIASTINAL MASS (N/A)  LOS: 4 days   Subjective: Patient feels well this morning, has his bags packed and wants to go home  Objective: Vital signs in last 24 hours: Patient Vitals for the past 24 hrs:  BP Temp Temp src Pulse Resp SpO2  10/24/17 0805 (!) 141/80 98.7 F (37.1 C) Oral 78 (!) 21 100 %  10/24/17 0343 132/77 98.5 F (36.9 C) Oral 71 16 100 %  10/23/17 2340 (!) 145/98 98.3 F (36.8 C) Oral 85 20 100 %  10/23/17 1951 130/76 98.6 F (37 C) Oral 80 (!) 22 100 %  10/23/17 1616 128/76 98.2 F (36.8 C) Oral 88 (!) 21 100 %  10/23/17 1117 132/76 97.9 F (36.6 C) Oral 83 - 100 %  10/23/17 1100 132/76 97.9 F (36.6 C) Oral 90 (!) 34 100 %    Filed Weights   10/20/17 0906  Weight: 218 lb (98.9 kg)    Hemodynamic parameters for last 24 hours:    Intake/Output from previous day: 05/17 0701 - 05/18 0700 In: 1005.7 [P.O.:840; I.V.:165.7] Out: -  Intake/Output this shift: Total I/O In: 240 [P.O.:240] Out: -   Scheduled Meds: . acetaminophen  1,000 mg Oral Q6H   Or  . acetaminophen (TYLENOL) oral liquid 160 mg/5 mL  1,000 mg Oral Q6H  . bisacodyl  10 mg Oral Daily  . fluticasone  1 spray Each Nare QODAY  . senna-docusate  1 tablet Oral QHS   Continuous Infusions: . dextrose 5 % and 0.45% NaCl Stopped (10/23/17 1117)  . potassium chloride     PRN Meds:.albuterol, hydrALAZINE, montelukast, oxyCODONE, potassium chloride, traMADol  General appearance: alert and cooperative Neurologic: intact Heart: regular rate and rhythm, S1, S2 normal, no murmur, click, rub or gallop Lungs: clear to auscultation bilaterally Wound: Incision and chest tube sites are intact  Lab Results: CBC: Recent Labs   10/22/17 0354  WBC 10.5  HGB 13.0  HCT 39.8  PLT 193   BMET:  Recent Labs    10/22/17 0354  NA 141  K 3.7  CL 103  CO2 30  GLUCOSE 108*  BUN 12  CREATININE 1.15  CALCIUM 8.7*    PT/INR: No results for input(s): LABPROT, INR in the last 72 hours.   Radiology Dg Chest 2 View  Result Date: 10/23/2017 CLINICAL DATA:  Status post chest tube removal EXAM: CHEST - 2 VIEW COMPARISON:  10/23/2015 FINDINGS: Cardiac shadow is stable. Left chest tube is been removed in the interval. Minimal apical pneumothorax is again noted and stable. Right jugular sheath has also been removed in the interval. No new focal infiltrate or effusion is seen. No bony abnormality is noted. Mild left lower lobe atelectatic changes are seen. IMPRESSION: No significant increase in pneumothorax following chest tube removal Mild left lower lobe atelectatic changes. Electronically Signed   By: Inez Catalina M.D.   On: 10/23/2017 09:15     Assessment/Plan: S/P Procedure(s) (LRB): VIDEO ASSISTED THORACOSCOPY (Left) RESECTION OF MEDIASTINAL MASS (N/A) Stable postop Plan discharge home today follow-up with [name] for suture removal  and follow-up x-ray   Grace Isaac MD 10/24/2017 10:08 AM

## 2017-10-24 NOTE — Progress Notes (Signed)
Fentanyl pca syringe wasted 15cc. Witness Tilda Burrow Rn and Kenson Groh rn

## 2017-10-29 ENCOUNTER — Other Ambulatory Visit: Payer: Self-pay

## 2017-10-29 DIAGNOSIS — G8918 Other acute postprocedural pain: Secondary | ICD-10-CM

## 2017-10-29 MED ORDER — OXYCODONE HCL 5 MG PO TABS: ORAL_TABLET | ORAL | 0 refills | Status: DC

## 2017-11-10 ENCOUNTER — Other Ambulatory Visit: Payer: Self-pay | Admitting: Cardiothoracic Surgery

## 2017-11-10 DIAGNOSIS — J9859 Other diseases of mediastinum, not elsewhere classified: Secondary | ICD-10-CM

## 2017-11-10 NOTE — Progress Notes (Signed)
cxr 

## 2017-11-11 ENCOUNTER — Encounter: Payer: Self-pay | Admitting: Cardiothoracic Surgery

## 2017-11-11 ENCOUNTER — Ambulatory Visit (INDEPENDENT_AMBULATORY_CARE_PROVIDER_SITE_OTHER): Payer: Self-pay | Admitting: Cardiothoracic Surgery

## 2017-11-11 ENCOUNTER — Other Ambulatory Visit: Payer: Self-pay

## 2017-11-11 ENCOUNTER — Ambulatory Visit
Admission: RE | Admit: 2017-11-11 | Discharge: 2017-11-11 | Disposition: A | Discharge status: Home / Self Care | Payer: BC Managed Care – PPO | Source: Ambulatory Visit | Attending: Cardiothoracic Surgery | Admitting: Cardiothoracic Surgery

## 2017-11-11 VITALS — BP 131/83 | HR 86 | Resp 16 | Ht 70.0 in | Wt 207.0 lb

## 2017-11-11 DIAGNOSIS — J9859 Other diseases of mediastinum, not elsewhere classified: Secondary | ICD-10-CM

## 2017-11-11 DIAGNOSIS — R222 Localized swelling, mass and lump, trunk: Secondary | ICD-10-CM

## 2017-11-11 DIAGNOSIS — Z09 Encounter for follow-up examination after completed treatment for conditions other than malignant neoplasm: Secondary | ICD-10-CM

## 2017-11-11 NOTE — Progress Notes (Signed)
PCP is Seward Carol, MD Referring Provider is Seward Carol, MD  Chief Complaint  Patient presents with  . Routine Post Op    f/u from surgery with CXR, s/p VATS, L mini thoracotomy, RESECTION OF MEDIASTINAL MASS    HPI: Scheduled 3 week follow-up after left VATS, resection of large painful paravertebral mass-pathology showed schwannoma.  Patient is doing well with expected postoperative thoracotomy pain.  His oxycodone requirement is decreasing and he is using Naprosyn and Tylenol.  He is having most problems at night with  neuritic pain and hypersensitivity and he will be given a prescription for Neurontin 300 mg p.o. Nightly.  The incision is healing well.  Chest tube sutures removed. P and lateral chest x-ray today is clear.  Patient may resume driving in light activities.  He is not ready to return to work.  He may lift up to 15 to 20 pounds as needed.   Copy of the pathology report was provided to  the patient  Past Medical History:  Diagnosis Date  . Asthma   . History of multiple allergies   . Mediastinal mass     Past Surgical History:  Procedure Laterality Date  . RESECTION OF MEDIASTINAL MASS N/A 10/20/2017   Procedure: RESECTION OF MEDIASTINAL MASS;  Surgeon: Ivin Poot, MD;  Location: [facility];  Service: Thoracic;  Laterality: N/A;  . VIDEO ASSISTED THORACOSCOPY Left 10/20/2017   Procedure: VIDEO ASSISTED THORACOSCOPY;  Surgeon: Ivin Poot, MD;  Location: [facility] OR;  Service: Thoracic;  Laterality: Left;    Family History  Problem Relation Age of Onset  . Colon cancer Mother   . CVA Mother   . Diabetes Mother   . Hypertension Father   . Hyperlipidemia Father     Social History Social History   Tobacco Use  . Smoking status: Never Smoker  . Smokeless tobacco: Never Used  Substance Use Topics  . Alcohol use: Yes    Frequency: Never    Comment: occasional  . Drug use: Never    Current Outpatient Medications  Medication Sig Dispense Refill  .  acetaminophen (TYLENOL) 500 MG tablet Take 2 tablets (1,000 mg total) by mouth every 6 (six) hours. 30 tablet 0  . albuterol (PROVENTIL HFA;VENTOLIN HFA) 108 (90 BASE) MCG/ACT inhaler Inhale 2 puffs into the lungs every 6 (six) hours as needed. For shortness of breath.    Marland Kitchen azelastine (ASTELIN) 0.1 % nasal spray Place 1 spray into both nostrils every other day. Alternate with rhinocort    . budesonide (RHINOCORT ALLERGY) 32 MCG/ACT nasal spray Place 1 spray into both nostrils every other day. Alternate with azelastine    . cetirizine (ZYRTEC) 10 MG tablet Take 10 mg by mouth daily.    Marland Kitchen EPINEPHrine (EPIPEN 2-PAK IJ) Inject 1 Syringe as directed as needed. For allergic reaction    . montelukast (SINGULAIR) 10 MG tablet Take 10 mg by mouth at bedtime as needed (wheezing).     . Multiple Vitamin (MULTIVITAMIN WITH MINERALS) TABS tablet Take 1 tablet by mouth daily.    Marland Kitchen oxyCODONE (OXY IR/ROXICODONE) 5 MG immediate release tablet 1 tablet every 8 hours prn/pain (Patient not taking: Reported on 11/11/2017) 25 tablet 0   No current facility-administered medications for this visit.     No Known Allergies  Review of Systems  No fever Slight weight loss postop No shortness of breath or productive cough  BP 131/83 (BP Location: Right Arm, Patient Position: Sitting, Cuff Size: Large)   Pulse  86   Resp 16   Ht 5\' 10"  (1.778 m)   Wt 207 lb (93.9 kg)   SpO2 99% Comment: RA  BMI 29.70 kg/m  Physical Exam      Exam    General- alert and comfortable    Neck- no JVD, no cervical adenopathy palpable, no carotid bruit   Lungs- clear without rales, wheezes.  Left VATS incision well-healed   Cor- regular rate and rhythm, no murmur , gallop   Abdomen- soft, non-tender   Extremities - warm, non-tender, minimal edema   Neuro- oriented, appropriate, no focal weakness   Diagnostic Tests: Chest x-ray clear  Impression: Doing well 3 weeks after resection of a large left schwannoma/paravertebral  mass Pain is fairly well-controlled but he would benefit from addition of Neurontin at bedtime Plan: Return in 3 weeks to review his progress, pain control, and to discuss return to work.   Len Childs, MD [facility] [phone]

## 2017-12-02 ENCOUNTER — Other Ambulatory Visit: Payer: Self-pay

## 2017-12-02 ENCOUNTER — Encounter: Payer: Self-pay | Admitting: Cardiothoracic Surgery

## 2017-12-02 ENCOUNTER — Ambulatory Visit (INDEPENDENT_AMBULATORY_CARE_PROVIDER_SITE_OTHER): Payer: Self-pay | Admitting: Cardiothoracic Surgery

## 2017-12-02 VITALS — BP 132/86 | HR 100 | Resp 16 | Ht 70.0 in | Wt 207.0 lb

## 2017-12-02 DIAGNOSIS — Z9889 Other specified postprocedural states: Secondary | ICD-10-CM

## 2017-12-02 DIAGNOSIS — R222 Localized swelling, mass and lump, trunk: Secondary | ICD-10-CM

## 2017-12-02 NOTE — Progress Notes (Signed)
PCP is Seward Carol, MD Referring Provider is Seward Carol, MD  Chief Complaint  Patient presents with  . 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  Chest x-ray has been clear.  Incision well-healed.  He is still having some postthoracotomy pain under the left anterior breast line.  He tried taking Neurontin which made him feel very sleepy and disoriented and stopped taking that medicine.  He is using an ice pack and Ace wraps around his chest for some relief.  He denies shortness of breath fever or recurrence of the pain in his chest from the large neural tumor.  He returns to discuss return to work. Past Medical History:  Diagnosis Date  . Asthma   . History of multiple allergies   . Mediastinal mass     Past Surgical History:  Procedure Laterality Date  . RESECTION OF MEDIASTINAL MASS N/A 10/20/2017   Procedure: RESECTION OF MEDIASTINAL MASS;  Surgeon: Ivin Poot, MD;  Location: [facility];  Service: Thoracic;  Laterality: N/A;  . VIDEO ASSISTED THORACOSCOPY Left 10/20/2017   Procedure: VIDEO ASSISTED THORACOSCOPY;  Surgeon: Ivin Poot, MD;  Location: [facility] OR;  Service: Thoracic;  Laterality: Left;    Family History  Problem Relation Age of Onset  . Colon cancer Mother   . CVA Mother   . Diabetes Mother   . Hypertension Father   . Hyperlipidemia Father     Social History Social History   Tobacco Use  . Smoking status: Never Smoker  . Smokeless tobacco: Never Used  Substance Use Topics  . Alcohol use: Yes    Frequency: Never    Comment: occasional  . Drug use: Never    Current Outpatient Medications  Medication Sig Dispense Refill  . acetaminophen (TYLENOL) 500 MG tablet Take 2 tablets (1,000 mg total) by mouth every 6 (six) hours. 30 tablet 0  . albuterol (PROVENTIL HFA;VENTOLIN HFA)  108 (90 BASE) MCG/ACT inhaler Inhale 2 puffs into the lungs every 6 (six) hours as needed. For shortness of breath.    Marland Kitchen azelastine (ASTELIN) 0.1 % nasal spray Place 1 spray into both nostrils every other day. Alternate with rhinocort    . budesonide (RHINOCORT ALLERGY) 32 MCG/ACT nasal spray Place 1 spray into both nostrils every other day. Alternate with azelastine    . cetirizine (ZYRTEC) 10 MG tablet Take 10 mg by mouth daily.    Marland Kitchen EPINEPHrine (EPIPEN 2-PAK IJ) Inject 1 Syringe as directed as needed. For allergic reaction    . montelukast (SINGULAIR) 10 MG tablet Take 10 mg by mouth at bedtime as needed (wheezing).     . Multiple Vitamin (MULTIVITAMIN WITH MINERALS) TABS tablet Take 1 tablet by mouth daily.     No current facility-administered medications for this visit.     No Known Allergies  Review of Systems  Overall appetite and strength improved. Weight stable. No fever.  BP 132/86 (BP Location: Right Arm, Patient Position: Sitting, Cuff Size: Large)   Pulse 100   Resp 16   Ht 5\' 10"  (1.778 m)   Wt 207 lb (93.9 kg)   SpO2 97% Comment: ON RA  BMI 29.70 kg/m  Physical Exam     Physical Exam Alert and responsive Left VATS incision well-healed Lungs clear Normal pulse and neuro function of left upper extremity  Cardiac rhythm regular without murmur or rub  Diagnostic Tests: No chest x-ray today  Impression: Progress but still with some postthoracotomy pain.  He does not wish to take narcotics.  I have recommended Tylenol and topical heat/cold and time.  He is reassured that the incision is healing well and that his postthoracotomy pain symptoms will improve.  Plan: Patient be set up for a CT scan of the chest 6 months after resection of the large schwannoma, to rule out recurrence. He was given a note to return to work on August 1.  Len Childs, MD [facility] [phone]

## 2018-03-19 ENCOUNTER — Other Ambulatory Visit: Payer: Self-pay | Admitting: Cardiothoracic Surgery

## 2018-03-19 DIAGNOSIS — J9859 Other diseases of mediastinum, not elsewhere classified: Secondary | ICD-10-CM

## 2018-03-19 NOTE — Progress Notes (Unsigned)
ct 

## 2018-05-05 ENCOUNTER — Inpatient Hospital Stay: Admission: RE | Admit: 2018-05-05 | Payer: BC Managed Care – PPO | Source: Ambulatory Visit

## 2018-05-05 ENCOUNTER — Ambulatory Visit: Payer: Self-pay | Admitting: Cardiothoracic Surgery

## 2018-05-13 ENCOUNTER — Ambulatory Visit
Admission: RE | Admit: 2018-05-13 | Discharge: 2018-05-13 | Disposition: A | Discharge status: Home / Self Care | Payer: BC Managed Care – PPO | Source: Ambulatory Visit | Attending: Cardiothoracic Surgery | Admitting: Cardiothoracic Surgery

## 2018-05-13 ENCOUNTER — Other Ambulatory Visit: Payer: Self-pay

## 2018-05-13 ENCOUNTER — Encounter: Payer: Self-pay | Admitting: Cardiothoracic Surgery

## 2018-05-13 ENCOUNTER — Ambulatory Visit: Payer: BC Managed Care – PPO | Admitting: Cardiothoracic Surgery

## 2018-05-13 VITALS — BP 127/84 | HR 78 | Resp 16 | Ht 70.0 in | Wt 223.0 lb

## 2018-05-13 DIAGNOSIS — Z9889 Other specified postprocedural states: Secondary | ICD-10-CM | POA: Diagnosis not present

## 2018-05-13 DIAGNOSIS — J9859 Other diseases of mediastinum, not elsewhere classified: Secondary | ICD-10-CM

## 2018-05-13 DIAGNOSIS — D3610 Benign neoplasm of peripheral nerves and autonomic nervous system, unspecified: Secondary | ICD-10-CM | POA: Diagnosis not present

## 2018-05-13 MED ORDER — IOPAMIDOL (ISOVUE-300) INJECTION 61%
75.0000 mL | Freq: Once | INTRAVENOUS | Status: AC | PRN
  Administered 2018-05-13: 75 mL via INTRAVENOUS

## 2018-05-13 NOTE — Progress Notes (Signed)
PCP is Seward Carol, MD Referring Provider is Seward Carol, MD  Chief Complaint  Patient presents with  . 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 Patient's post op thoracotomy pain has significant improved.  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  He has had no problems using his left arm.  CT scan performed today shows no evidence of current tumor, the site of the surgery is clean.  No other abnormalities on chest CT scan.   Past Medical History:  Diagnosis Date  . Asthma   . History of multiple allergies   . Mediastinal mass     Past Surgical History:  Procedure Laterality Date  . RESECTION OF MEDIASTINAL MASS N/A 10/20/2017   Procedure: RESECTION OF MEDIASTINAL MASS;  Surgeon: Ivin Poot, MD;  Location: [facility];  Service: Thoracic;  Laterality: N/A;  . VIDEO ASSISTED THORACOSCOPY Left 10/20/2017   Procedure: VIDEO ASSISTED THORACOSCOPY;  Surgeon: Ivin Poot, MD;  Location: [facility] OR;  Service: Thoracic;  Laterality: Left;    Family History  Problem Relation Age of Onset  . Colon cancer Mother   . CVA Mother   . Diabetes Mother   . Hypertension Father   . Hyperlipidemia Father     Social History Social History   Tobacco Use  . Smoking status: Never Smoker  . Smokeless tobacco: Never Used  Substance Use Topics  . Alcohol use: Yes    Frequency: Never    Comment: occasional  . Drug use: Never    Current Outpatient Medications  Medication Sig Dispense Refill  . acetaminophen (TYLENOL) 500 MG tablet Take 2 tablets (1,000 mg total) by mouth every 6 (six) hours. 30 tablet 0  . albuterol (PROVENTIL HFA;VENTOLIN HFA) 108 (90 BASE) MCG/ACT inhaler Inhale 2 puffs into the lungs every 6 (six) hours as needed. For shortness of breath.    Marland Kitchen azelastine (ASTELIN) 0.1 % nasal spray Place 1 spray into  both nostrils every other day. Alternate with rhinocort    . budesonide (RHINOCORT ALLERGY) 32 MCG/ACT nasal spray Place 1 spray into both nostrils every other day. Alternate with azelastine    . cetirizine (ZYRTEC) 10 MG tablet Take 10 mg by mouth daily.    Marland Kitchen EPINEPHrine (EPIPEN 2-PAK IJ) Inject 1 Syringe as directed as needed. For allergic reaction    . montelukast (SINGULAIR) 10 MG tablet Take 10 mg by mouth at bedtime as needed (wheezing).     . Multiple Vitamin (MULTIVITAMIN WITH MINERALS) TABS tablet Take 1 tablet by mouth daily.     No current facility-administered medications for this visit.     No Known Allergies  Review of Systems  Becoming more active No fever No palpitations No shortness of breath No edema Productive cough No chest pain Abdominal pain  BP 127/84 (BP Location: Right Arm, Patient Position: Sitting, Cuff Size: Large)   Pulse 78   Resp 16   Ht 5\' 10"  (1.778 m)   Wt 223 lb (101.2 kg)   SpO2 97% Comment: ON RA  BMI 32.00 kg/m  Physical Exam      Exam    General- alert and comfortable    Neck- no JVD, no cervical adenopathy palpable, no carotid bruit   Lungs- clear without rales, wheezes   Cor- regular rate and rhythm, no murmur , gallop  Abdomen- soft, non-tender   Extremities - warm, non-tender, minimal edema   Neuro- oriented, appropriate, no focal weakness   Diagnostic Tests: CT scan images personally reviewed.  They show no evidence of recurrent mediastinal mass- benign neural tumor. Lungs otherwise are clean.  No abnormal mediastinal adenopathy.  Impression: Patient doing well 6 months after resection of a large posterior mediastinal schwannoma.  CT scan shows no evidence of recurrence.  Patient has all restrictions for activity.  Plan: Patient return for follow-up CT scan 1 year postop for surveillance of recurrent tumor.   Len Childs, MD [facility] [phone]

## 2018-07-22 ENCOUNTER — Other Ambulatory Visit: Payer: Self-pay | Admitting: Internal Medicine

## 2018-07-22 ENCOUNTER — Ambulatory Visit
Admission: RE | Admit: 2018-07-22 | Discharge: 2018-07-22 | Disposition: A | Discharge status: Home / Self Care | Payer: BC Managed Care – PPO | Source: Ambulatory Visit | Attending: Internal Medicine | Admitting: Internal Medicine

## 2018-07-22 DIAGNOSIS — M25571 Pain in right ankle and joints of right foot: Secondary | ICD-10-CM

## 2018-09-08 ENCOUNTER — Other Ambulatory Visit: Payer: Self-pay | Admitting: Cardiothoracic Surgery

## 2018-09-08 DIAGNOSIS — R911 Solitary pulmonary nodule: Secondary | ICD-10-CM

## 2018-09-08 DIAGNOSIS — J9859 Other diseases of mediastinum, not elsewhere classified: Secondary | ICD-10-CM

## 2018-11-15 ENCOUNTER — Inpatient Hospital Stay: Admission: RE | Admit: 2018-11-15 | Payer: BC Managed Care – PPO | Source: Ambulatory Visit

## 2018-11-17 ENCOUNTER — Ambulatory Visit: Payer: BC Managed Care – PPO | Admitting: Cardiothoracic Surgery

## 2018-12-08 ENCOUNTER — Ambulatory Visit: Payer: BC Managed Care – PPO | Admitting: Cardiothoracic Surgery

## 2018-12-08 ENCOUNTER — Ambulatory Visit
Admission: RE | Admit: 2018-12-08 | Discharge: 2018-12-08 | Disposition: A | Discharge status: Home / Self Care | Payer: BC Managed Care – PPO | Source: Ambulatory Visit | Attending: Cardiothoracic Surgery | Admitting: Cardiothoracic Surgery

## 2018-12-08 ENCOUNTER — Encounter: Payer: Self-pay | Admitting: Cardiothoracic Surgery

## 2018-12-08 ENCOUNTER — Other Ambulatory Visit: Payer: Self-pay

## 2018-12-08 VITALS — BP 130/83 | HR 86 | Temp 97.9°F | Resp 20 | Ht 70.0 in | Wt 235.0 lb

## 2018-12-08 DIAGNOSIS — D3610 Benign neoplasm of peripheral nerves and autonomic nervous system, unspecified: Secondary | ICD-10-CM

## 2018-12-08 DIAGNOSIS — R911 Solitary pulmonary nodule: Secondary | ICD-10-CM

## 2018-12-08 MED ORDER — IOPAMIDOL (ISOVUE-300) INJECTION 61%
75.0000 mL | Freq: Once | INTRAVENOUS | Status: AC | PRN
  Administered 2018-12-08: 75 mL via INTRAVENOUS

## 2018-12-08 NOTE — Progress Notes (Signed)
PCP is Seward Carol, MD Referring Provider is Seward Carol, MD  Chief Complaint  Patient presents with  . Mediastinal Mass    6 month f/u with Chest CT, surveillance of recurrent tumor    HPI: Patient returns for one-year follow-up with CT scan of chest after left VATS and resection of a left apical 5 cm schwannoma.  The patient's neuritic pain has resolved and he is no longer taking [place].  No pulmonary symptoms  I reviewed the results of the chest CT scan with the patient shows no evidence of residual or recurrent disease.  The pleural surfaces are clear. No further surveillance scans are needed for this benign tumor.  Past Medical History:  Diagnosis Date  . Asthma   . History of multiple allergies   . Mediastinal mass     Past Surgical History:  Procedure Laterality Date  . RESECTION OF MEDIASTINAL MASS N/A 10/20/2017   Procedure: RESECTION OF MEDIASTINAL MASS;  Surgeon: Ivin Poot, MD;  Location: [facility];  Service: Thoracic;  Laterality: N/A;  . VIDEO ASSISTED THORACOSCOPY Left 10/20/2017   Procedure: VIDEO ASSISTED THORACOSCOPY;  Surgeon: Ivin Poot, MD;  Location: [facility] OR;  Service: Thoracic;  Laterality: Left;    Family History  Problem Relation Age of Onset  . Colon cancer Mother   . CVA Mother   . Diabetes Mother   . Hypertension Father   . Hyperlipidemia Father     Social History Social History   Tobacco Use  . Smoking status: Never Smoker  . Smokeless tobacco: Never Used  Substance Use Topics  . Alcohol use: Yes    Frequency: Never    Comment: occasional  . Drug use: Never    Current Outpatient Medications  Medication Sig Dispense Refill  . acetaminophen (TYLENOL) 500 MG tablet Take 2 tablets (1,000 mg total) by mouth every 6 (six) hours. 30 tablet 0  . albuterol (PROVENTIL HFA;VENTOLIN HFA) 108 (90 BASE) MCG/ACT inhaler Inhale 2 puffs into the lungs every 6 (six) hours as needed. For shortness of breath.    . budesonide (RHINOCORT  ALLERGY) 32 MCG/ACT nasal spray Place 1 spray into both nostrils every other day. Alternate with azelastine    . cetirizine (ZYRTEC) 10 MG tablet Take 10 mg by mouth daily.    Marland Kitchen EPINEPHrine (EPIPEN 2-PAK IJ) Inject 1 Syringe as directed as needed. For allergic reaction    . montelukast (SINGULAIR) 10 MG tablet Take 10 mg by mouth at bedtime as needed (wheezing).     . Multiple Vitamin (MULTIVITAMIN WITH MINERALS) TABS tablet Take 1 tablet by mouth daily.     No current facility-administered medications for this visit.     No Known Allergies  Review of Systems  Weight stable Minimal chest discomfort No palpitations or shortness of breath or chest pain Minimal numbness in the left chest No edema No abdominal pain No difficulty swallowing  BP 130/83   Pulse 86   Temp 97.9 F (36.6 C) (Skin)   Resp 20   Ht 5\' 10"  (1.778 m)   Wt 235 lb (106.6 kg)   SpO2 95% Comment: RA  BMI 33.72 kg/m  Physical Exam        Exam    General- alert and comfortable    Neck- no JVD, no cervical adenopathy palpable, no carotid bruit   Lungs- clear without rales, wheezes   Cor- regular rate and rhythm, no murmur , gallop   Abdomen- soft, non-tender   Extremities - warm,  non-tender, minimal edema   Neuro- oriented, appropriate, no focal weakness     Diagnostic Tests: CT scan clear after resection of left apical 5 cm schwannoma  Impression: Patient has recovered No further follow-up scans required No limitations on activity  Plan: Return as needed  Len Childs, MD [facility] [phone]

## 2019-05-02 ENCOUNTER — Other Ambulatory Visit: Payer: Self-pay

## 2019-05-02 DIAGNOSIS — Z20822 Contact with and (suspected) exposure to covid-19: Secondary | ICD-10-CM

## 2019-05-03 LAB — NOVEL CORONAVIRUS, NAA: SARS-CoV-2, NAA: DETECTED — AB

## 2019-08-04 ENCOUNTER — Other Ambulatory Visit (HOSPITAL_BASED_OUTPATIENT_CLINIC_OR_DEPARTMENT_OTHER): Payer: Self-pay

## 2019-08-04 DIAGNOSIS — R0683 Snoring: Secondary | ICD-10-CM

## 2019-08-04 DIAGNOSIS — G4730 Sleep apnea, unspecified: Secondary | ICD-10-CM

## 2019-08-08 ENCOUNTER — Ambulatory Visit (HOSPITAL_BASED_OUTPATIENT_CLINIC_OR_DEPARTMENT_OTHER): Payer: BC Managed Care – PPO | Attending: Otolaryngology | Admitting: Internal Medicine

## 2019-12-11 IMAGING — CR DG CHEST 2V
2 series · 2 of 2 positions shown · non-contrast
Comparison: Chest radiograph August 31, 2017 and chest CT August 31, 2017

CLINICAL DATA: Mediastinal mass

EXAM:
CHEST - 2 VIEW

[w chest pa]
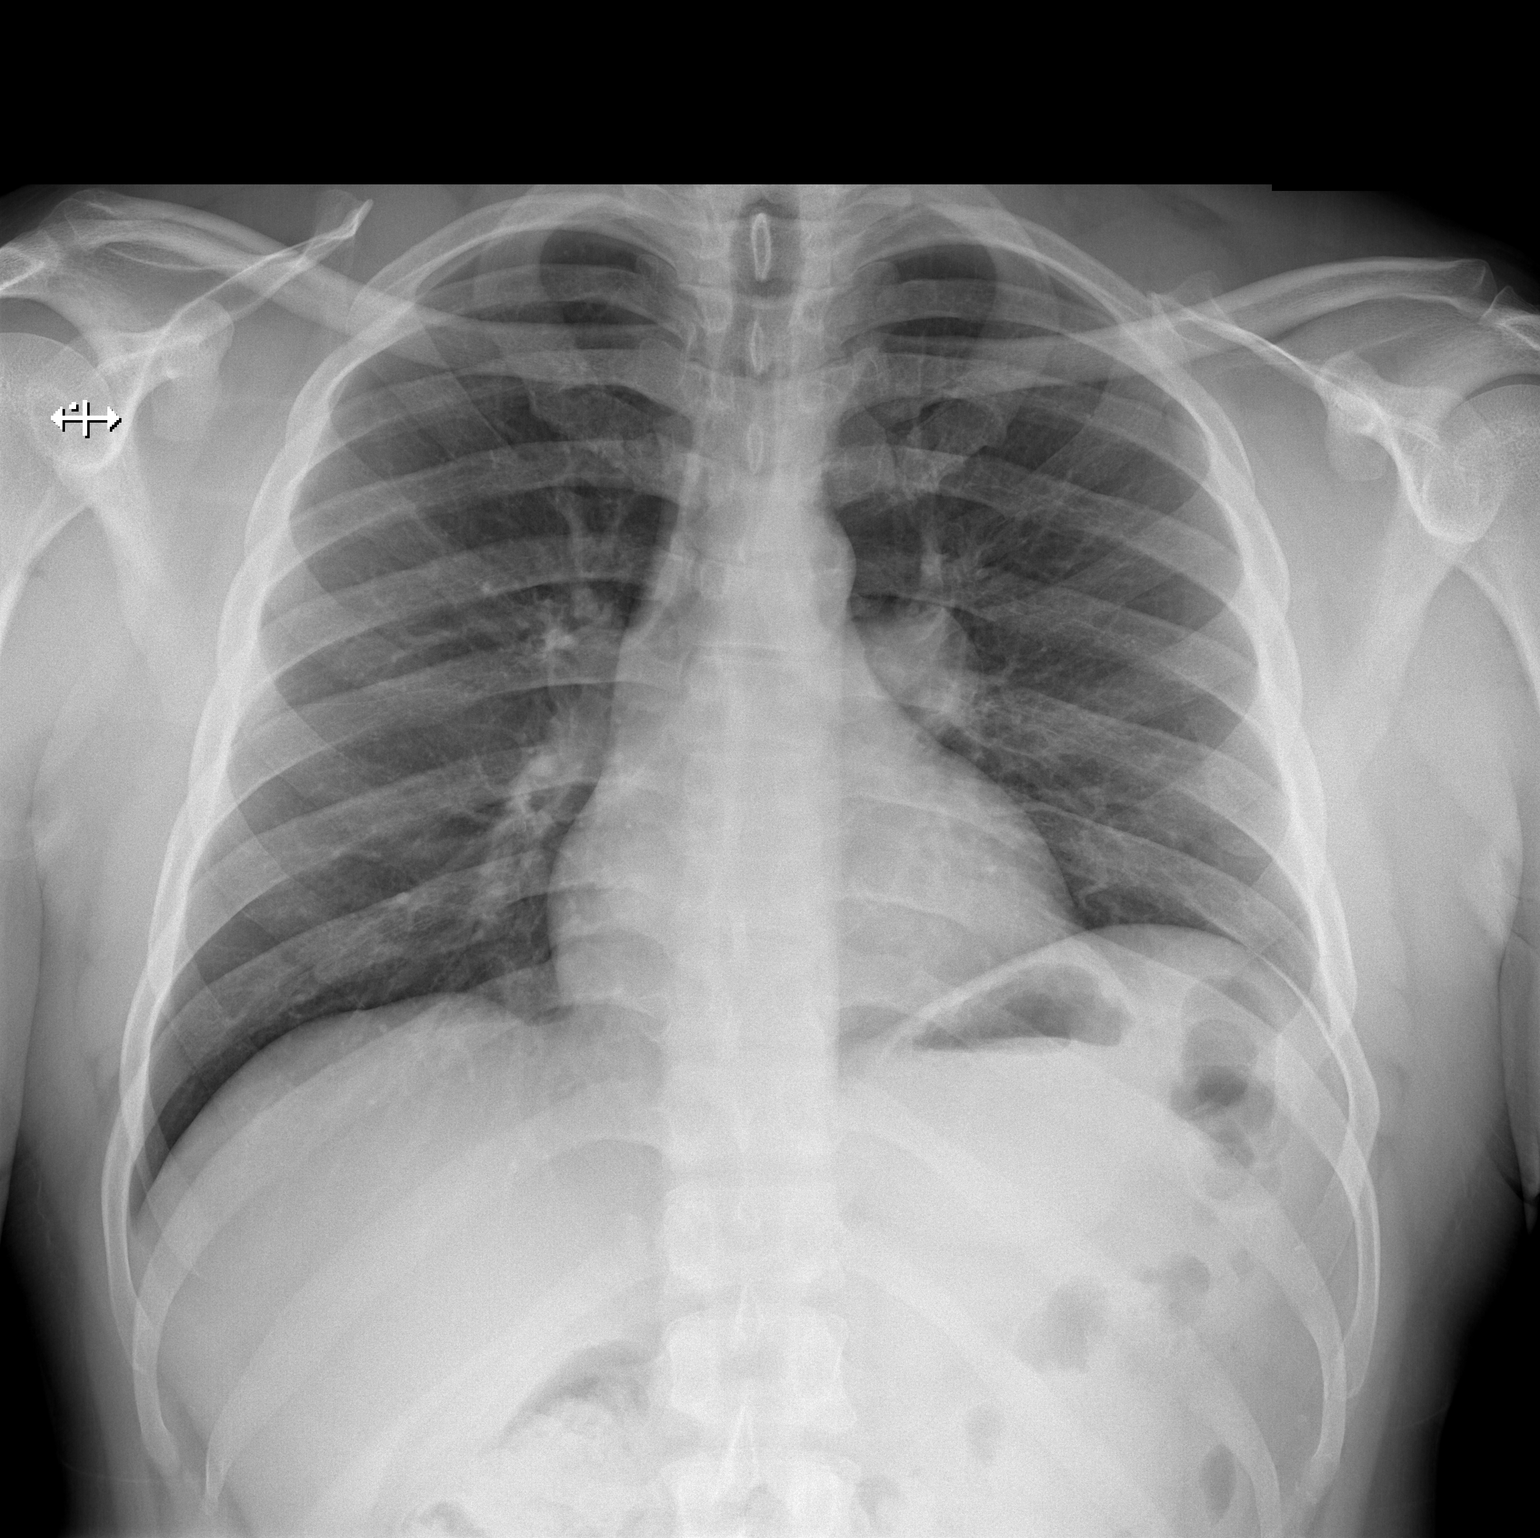

[w chest lat]
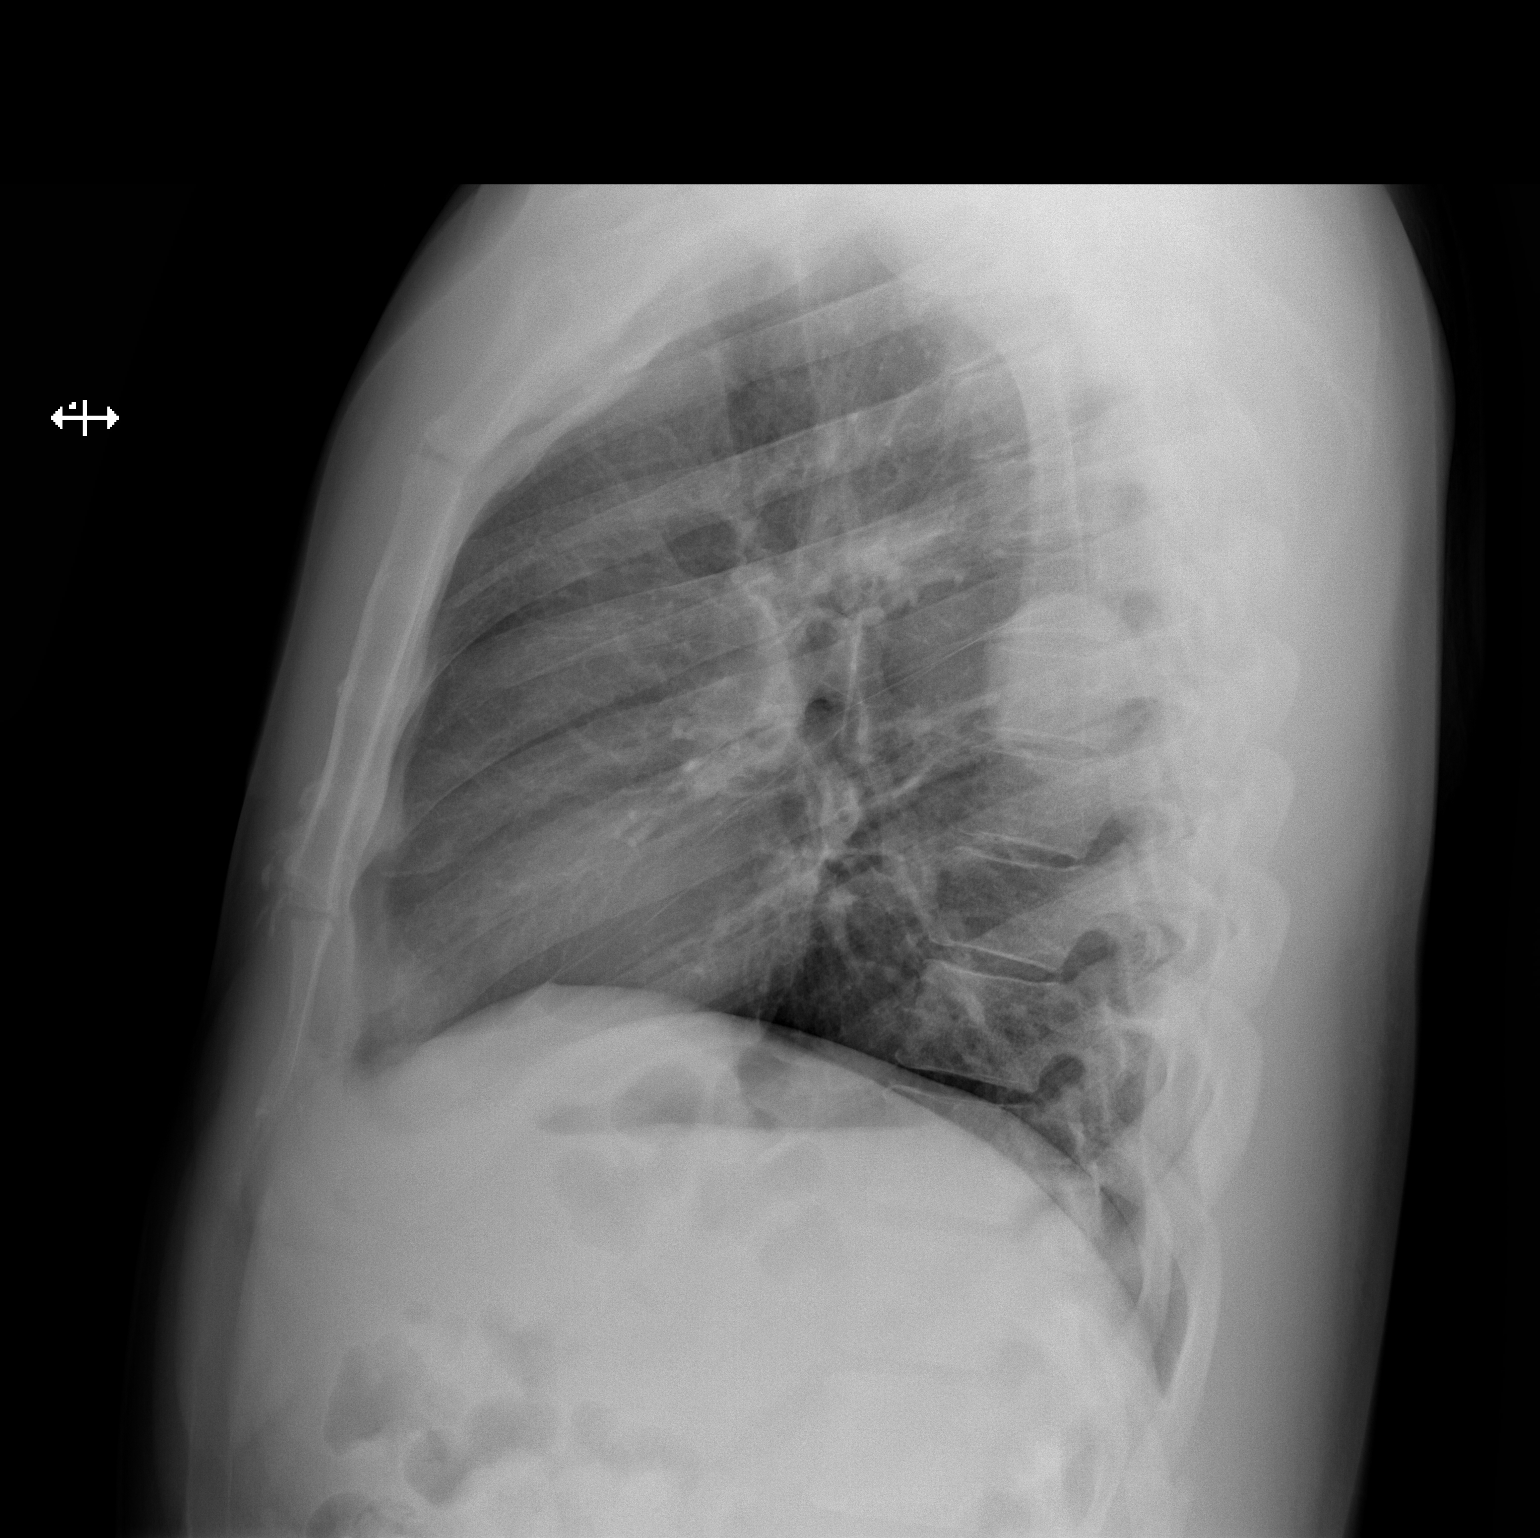

[2 of 2 positions shown; findings below may reference images not displayed]

FINDINGS: The left-sided posterior mediastinal mass noted previously is again
present. By radiography, it measures 4.8 x 4.4 x 3.4 cm. No new
mediastinal masses appreciable. Lungs are clear. Heart size and
pulmonary vascularity are normal. No adenopathy evident. No bone
lesions.
IMPRESSION: Left-sided posterior mediastinal mass centered at the level of T7
again noted as described. No bony lesion evident by radiography.
Lungs clear. No new lesion evident.

## 2019-12-12 IMAGING — DX DG CHEST 1V PORT
1 series · 1 of 1 positions shown · non-contrast
Comparison: 10/19/2017

CLINICAL DATA: Status post thoracotomy, left-sided chest pain

EXAM:
PORTABLE CHEST 1 VIEW

[chest ap]
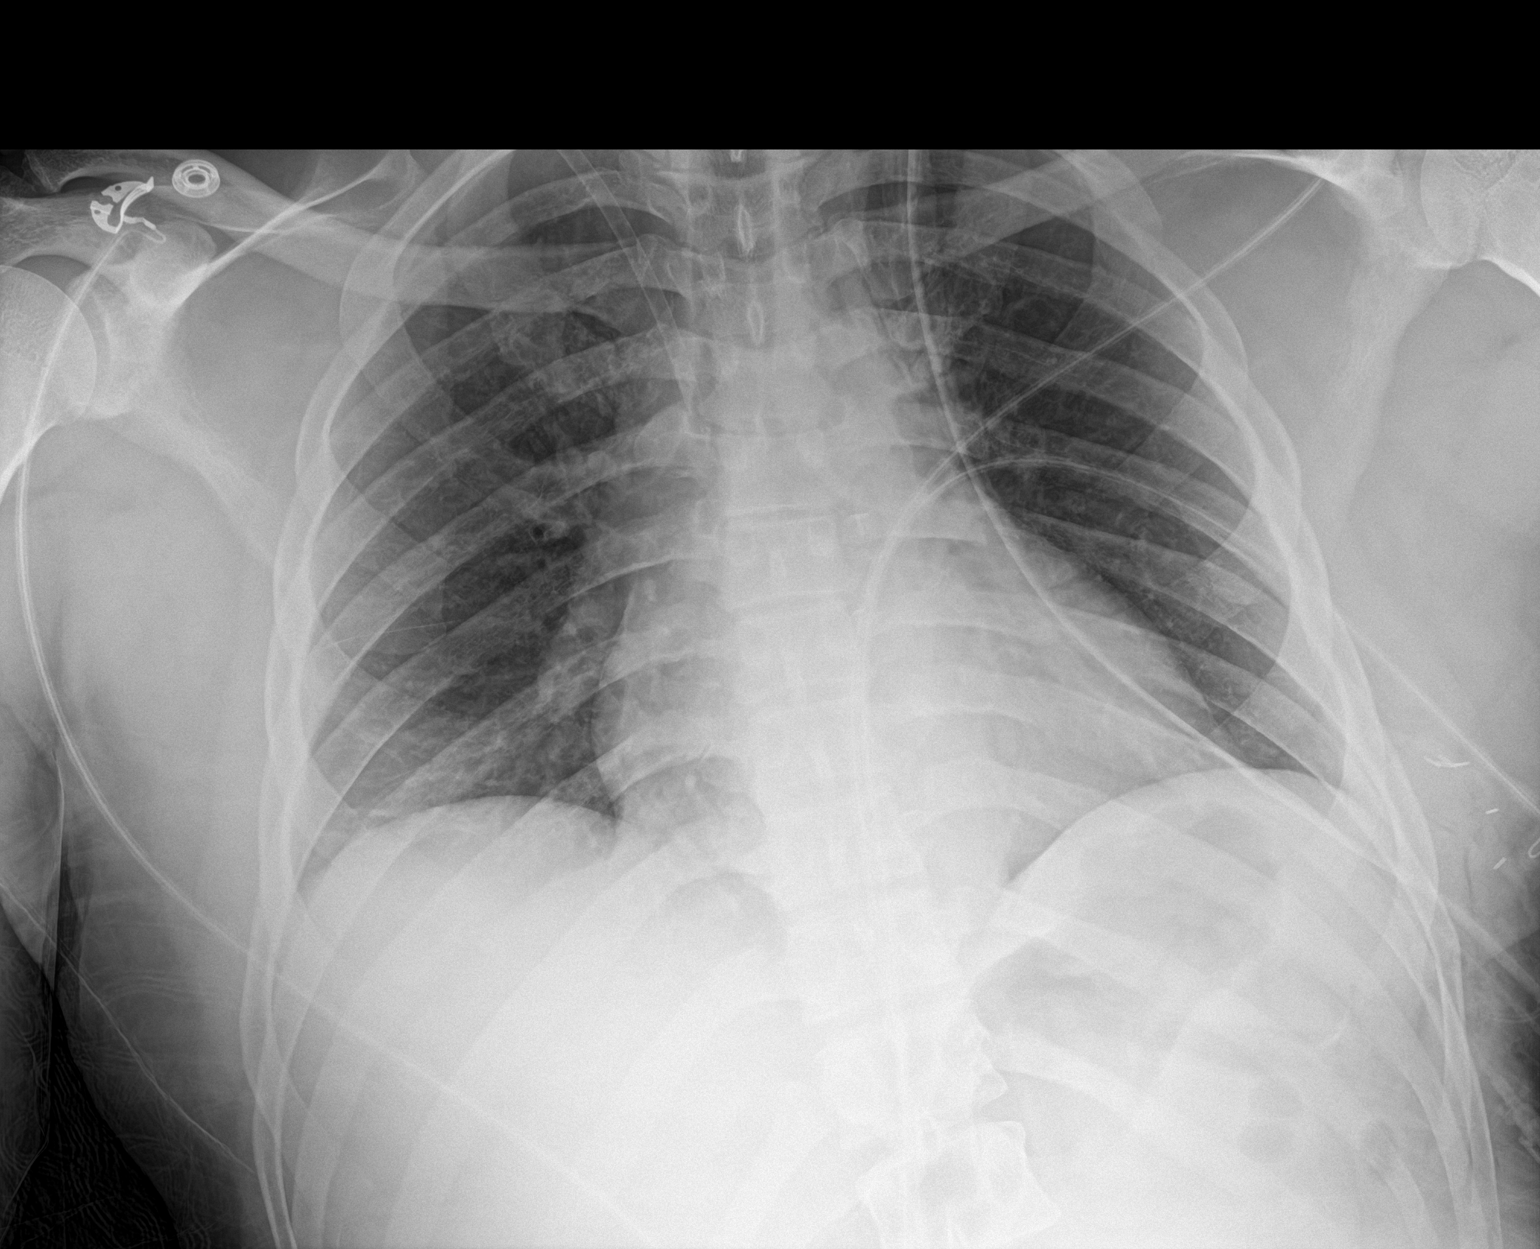

[1 of 1 positions shown; findings below may reference images not displayed]

FINDINGS: Postsurgical changes are noted on the left with chest tube in place.
No pneumothorax is identified. No focal infiltrate is seen. Cardiac
shadow is stable. Right jugular sheath is noted. Previously seen
mediastinal mass has been removed.
IMPRESSION: Postsurgical changes on the left without evidence of pneumothorax.

## 2020-07-04 IMAGING — CT CT CHEST W/ CM
2 of 4 series · 14 of 36 positions shown, 17 images · IV contrast (iopamidol)
Comparison: MR 09/12/2017, 09/01/2017, CT 08/31/2017

CLINICAL DATA: 42-year-old male with a history of prior VATS for
resection of posterior mediastinal mass, pathology compatible with
schwannoma

EXAM:
CT CHEST WITH CONTRAST
TECHNIQUE: Multidetector CT imaging of the chest was performed during
intravenous contrast administration.
CONTRAST:  75mL OUCCBG-466 IOPAMIDOL (OUCCBG-466) INJECTION 61%

[Series 2: chest 2.00 br40 s3 ax · axial · 0.63mm/px · z∈[+1392,+1634]mm · 11 of 145 slices shown, 14 images]
[im 12/145  mediastinal]
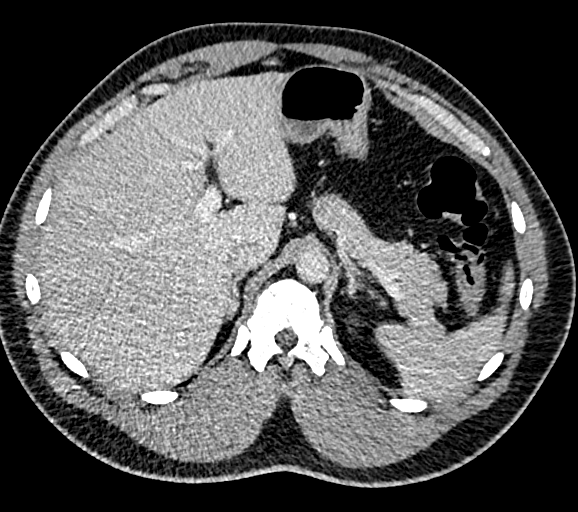
[im 12/145  lung]
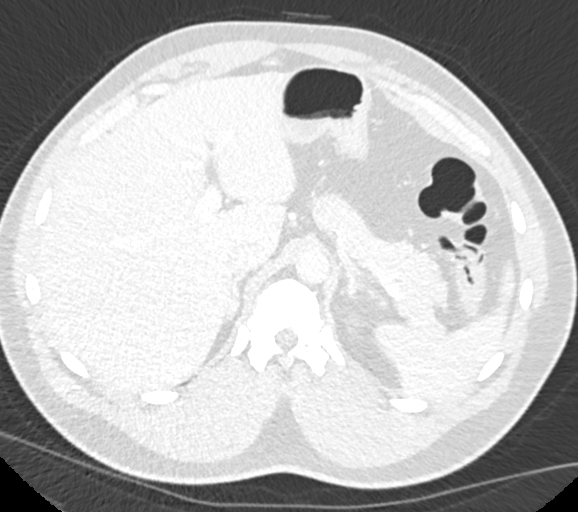
[im 23/145  lung]
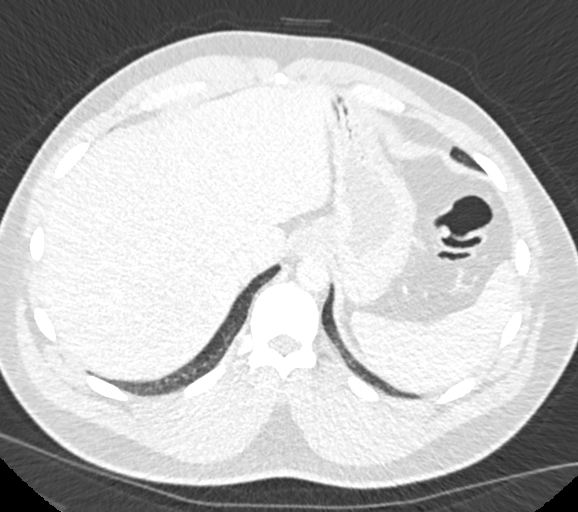
[im 34/145  lung]
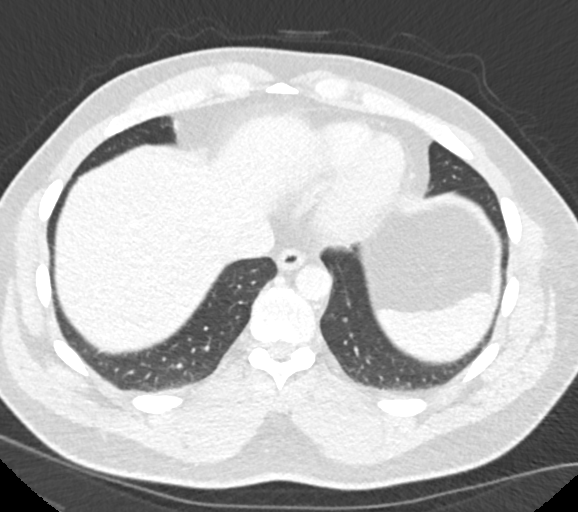
[im 45/145  lung]
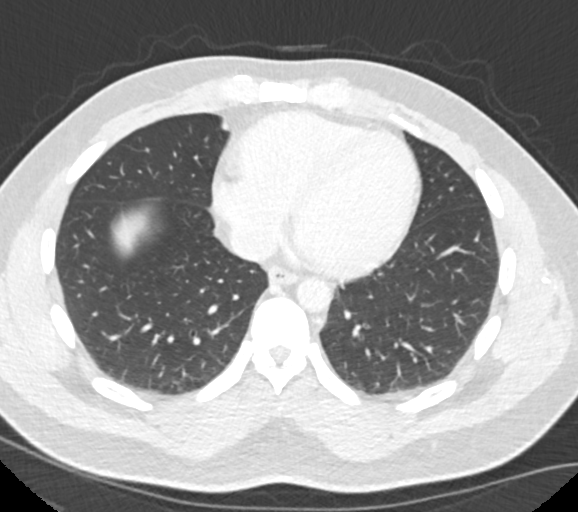
[im 56/145  mediastinal]
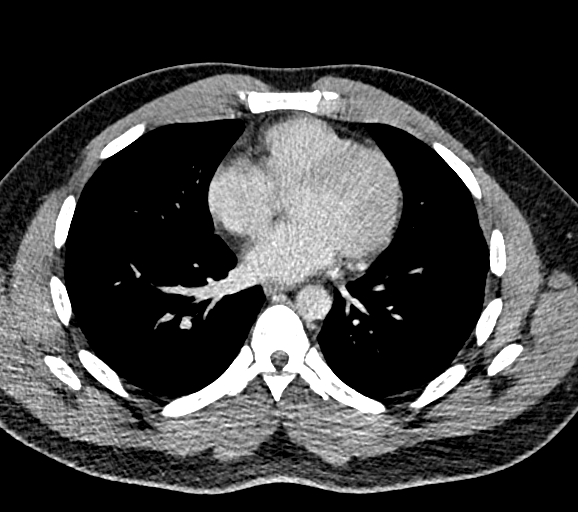
[im 56/145  lung]
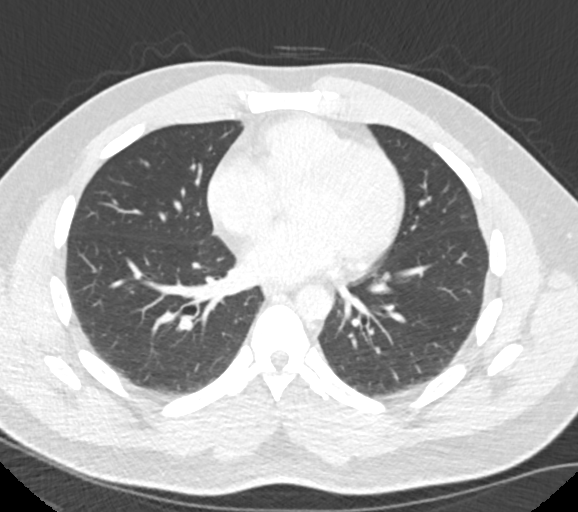
[im 78/145  lung]
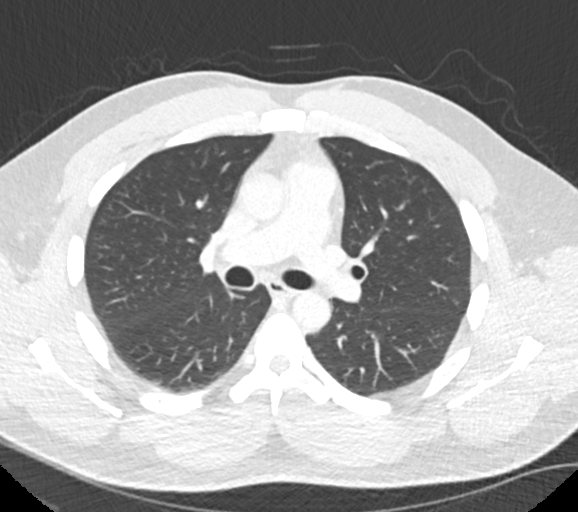
[im 89/145  lung]
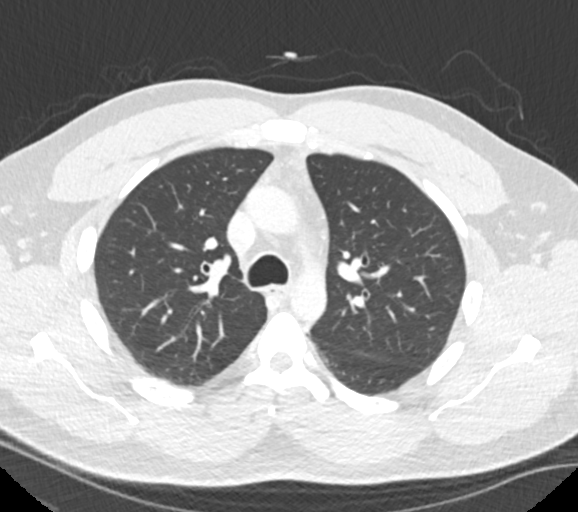
[im 100/145  lung]
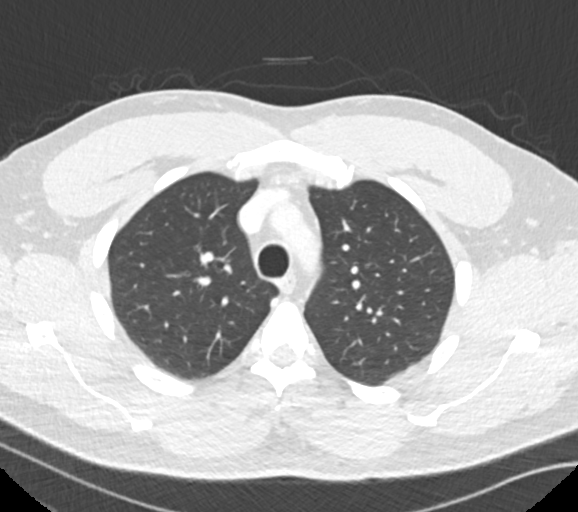
[im 111/145  mediastinal]
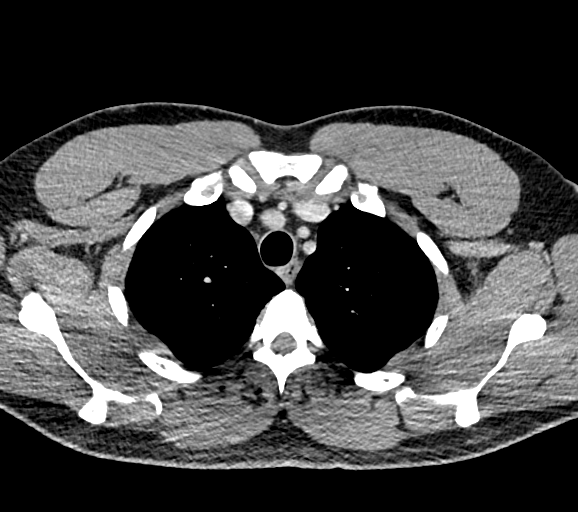
[im 111/145  lung]
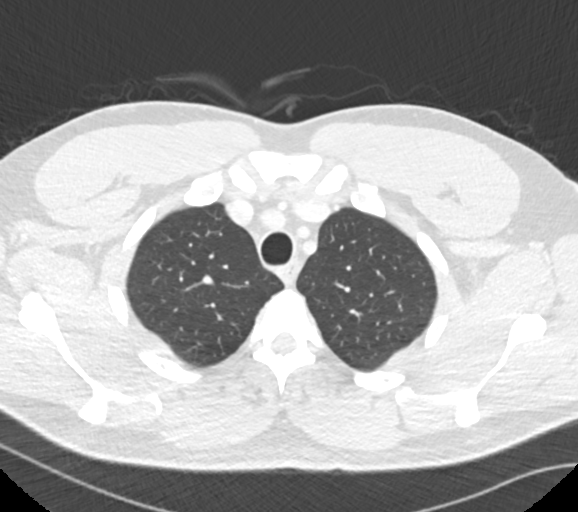
[im 122/145  lung]
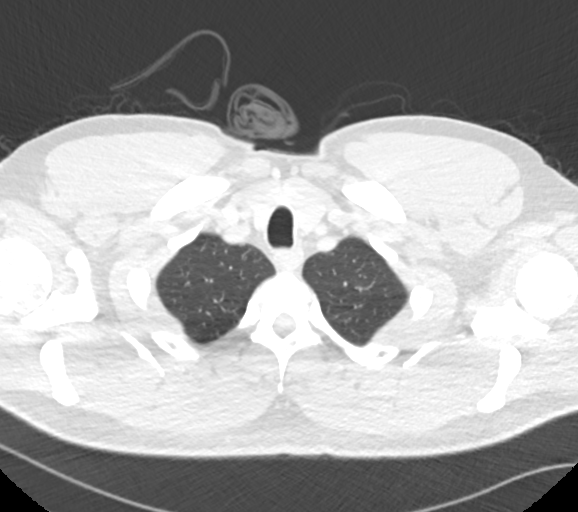
[im 133/145  lung]
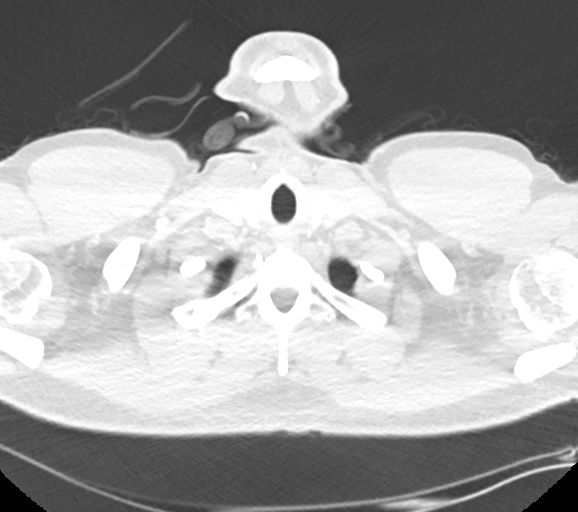

[Series 4: chest 2.00 br40 s3 cor · coronal · 0.57mm/px · 3 of 160 slices shown]
[im 32/160  lung]
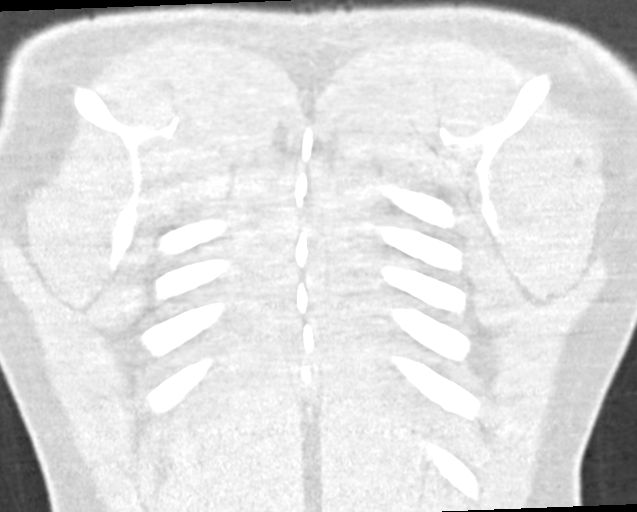
[im 64/160  lung]
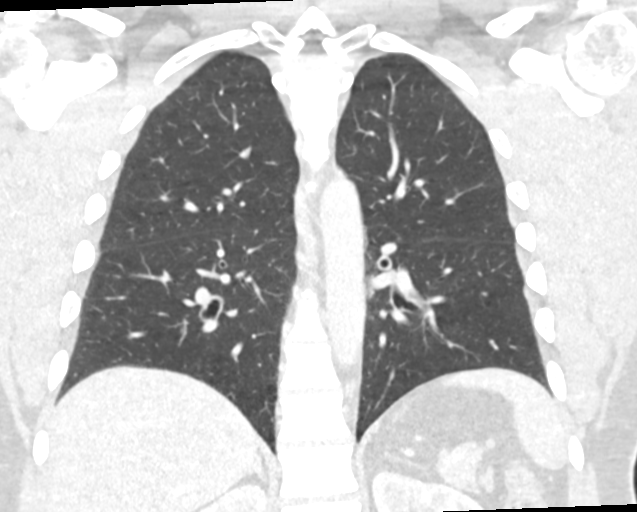
[im 96/160  lung]
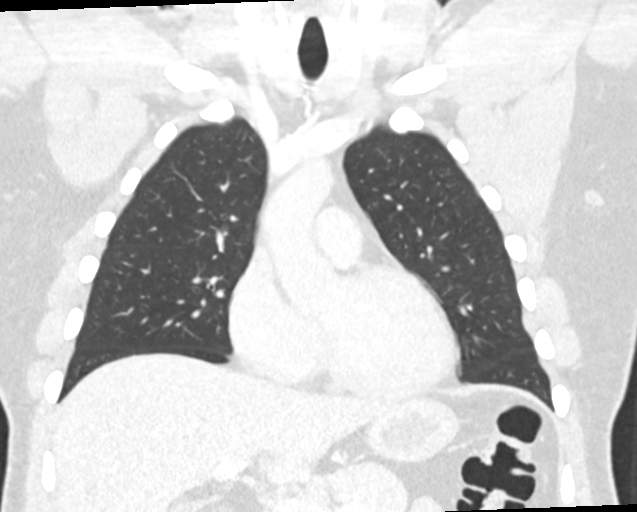

[14 of 36 positions shown; findings below may reference images not displayed]

FINDINGS: Cardiovascular: Heart size within normal limits. No pericardial
fluid/thickening. Unremarkable course caliber and contour of the
thoracic aorta. Unremarkable pulmonary artery diameter. No
significant atherosclerotic calcifications of the coronary arteries.

Mediastinum/Nodes: No mediastinal adenopathy. Unremarkable course of
the thoracic esophagus. No endotracheal or endobronchial debris.
Unremarkable thoracic inlet. No supraclavicular or axillary
adenopathy.

No residual lesion at the site of the prior posterior mediastinal
mass with post surgical clips at the site of the prior resection at
T7.

Lungs/Pleura: Lungs are clear. No pleural effusion or pneumothorax.

Upper Abdomen: No acute finding of the upper abdomen.

Musculoskeletal: Surgical changes in the left chest wall and the
left aspect of the posterior mediastinum. No acute displaced
fracture. Unremarkable appearance of the ribs.
IMPRESSION: Interval resection of posterior mediastinal schwannoma at the T7
level, with no evidence of recurrence and no complicating features.

## 2021-04-05 ENCOUNTER — Other Ambulatory Visit
Admission: RE | Admit: 2021-04-05 | Discharge: 2021-04-05 | Disposition: A | Discharge status: Home / Self Care | Payer: BC Managed Care – PPO | Source: Ambulatory Visit | Attending: Student | Admitting: Student

## 2021-04-05 DIAGNOSIS — M25561 Pain in right knee: Secondary | ICD-10-CM | POA: Diagnosis not present

## 2021-04-05 DIAGNOSIS — M79661 Pain in right lower leg: Secondary | ICD-10-CM | POA: Diagnosis not present

## 2021-04-05 LAB — D-DIMER, QUANTITATIVE: D-Dimer, Quant: 0.39 ug/mL-FEU (ref 0.00–0.50)

## 2021-09-18 ENCOUNTER — Other Ambulatory Visit: Payer: Self-pay | Admitting: Otolaryngology

## 2021-09-18 DIAGNOSIS — J329 Chronic sinusitis, unspecified: Secondary | ICD-10-CM

## 2021-09-30 ENCOUNTER — Ambulatory Visit
Admission: RE | Admit: 2021-09-30 | Discharge: 2021-09-30 | Disposition: A | Discharge status: Home / Self Care | Payer: BC Managed Care – PPO | Source: Ambulatory Visit | Attending: Otolaryngology | Admitting: Otolaryngology

## 2021-09-30 DIAGNOSIS — J329 Chronic sinusitis, unspecified: Secondary | ICD-10-CM

## 2022-01-02 ENCOUNTER — Other Ambulatory Visit (INDEPENDENT_AMBULATORY_CARE_PROVIDER_SITE_OTHER): Payer: Self-pay | Admitting: Otolaryngology

## 2022-05-07 ENCOUNTER — Ambulatory Visit: Payer: BC Managed Care – PPO | Attending: Cardiovascular Disease | Admitting: Cardiovascular Disease

## 2022-05-07 VITALS — BP 136/84 | HR 76 | Ht 70.0 in | Wt 234.4 lb

## 2022-05-07 DIAGNOSIS — R072 Precordial pain: Secondary | ICD-10-CM

## 2022-05-07 DIAGNOSIS — J9859 Other diseases of mediastinum, not elsewhere classified: Secondary | ICD-10-CM | POA: Diagnosis not present

## 2022-05-07 DIAGNOSIS — G4733 Obstructive sleep apnea (adult) (pediatric): Secondary | ICD-10-CM

## 2022-05-07 DIAGNOSIS — E7800 Pure hypercholesterolemia, unspecified: Secondary | ICD-10-CM

## 2022-05-07 MED ORDER — METOPROLOL TARTRATE 100 MG PO TABS: 100.0000 mg | ORAL_TABLET | Freq: Once | ORAL | 0 refills | Status: AC

## 2022-05-07 NOTE — Patient Instructions (Addendum)
Medication Instructions:  Your physician recommends that you continue on your current medications as directed. Please refer to the Current Medication list given to you today.  *If you need a refill on your cardiac medications before your next appointment, please call your pharmacy*   Lab Work: Your physician recommends that you return for lab work in: prior to coronary CTA- FASTING CMET, CBC, Lipids & LP(a)  If you have labs (blood work) drawn today and your tests are completely normal, you will receive your results only by: [place] (if you have MyChart) OR A paper copy in the mail If you have any lab test that is abnormal or we need to change your treatment, we will call you to review the results.   Testing/Procedures: Your physician has requested that you have an echocardiogram. Echocardiography is a painless test that uses sound waves to create images of your heart. It provides your doctor with information about the size and shape of your heart and how well your heart's chambers and valves are working. This procedure takes approximately one hour. There are no restrictions for this procedure. Please do NOT wear cologne, perfume, aftershave, or lotions (deodorant is allowed). Please arrive 15 minutes prior to your appointment time. This procedure will be done at [address]   Follow-Up: At [facility], you and your health needs are our priority.  As part of our continuing mission to provide you with exceptional heart care, we have created designated Provider Care Teams.  These Care Teams include your primary Cardiologist (physician) and Advanced Practice Providers (APPs -  Physician Assistants and Nurse Practitioners) who all work together to provide you with the care you need, when you need it.  We recommend signing up for the patient portal called "MyChart".  Sign up information is provided on this After Visit Summary.  MyChart is used to connect with  patients for Virtual Visits (Telemedicine).  Patients are able to view lab/test results, encounter notes, upcoming appointments, etc.  Non-urgent messages can be sent to your provider as well.   To learn more about what you can do with MyChart, go to [email or web address].    Your next appointment:   6 week(s)  The format for your next appointment:   In Person  Provider:   Shelva Majestic, MD   Other Instructions   Your cardiac CT will be scheduled at the below location:   [facility] [address] [place], [place] [address] [phone]   If scheduled at [facility], please arrive at the [facility] Entrance (Entrance C2) of [facility] 30 minutes prior to test start time. You can use the FREE valet parking offered at entrance C (encouraged to control the heart rate for the test)  Proceed to the [facility] Radiology Department (first floor) to check-in and test prep.  All radiology patients and guests should use entrance C2 at [facility], accessed from [facility], even though the hospital's physical address listed is [address]     Please follow these instructions carefully (unless otherwise directed):  Hold all erectile dysfunction medications at least 3 days (72 hrs) prior to test. (Ie viagra, cialis, sildenafil, tadalafil, etc) We will administer nitroglycerin during this exam.   On the Night Before the Test: Be sure to Drink plenty of water. Do not consume any caffeinated/decaffeinated beverages or chocolate 12 hours prior to your test. Do not take any antihistamines 12 hours  prior to your test.  On the Day of the Test: Drink plenty of water until 1 hour prior to the test. Do not eat any food 1 hour prior to test. You may take your regular medications prior to the test.  Take metoprolol (Lopressor) '100mg'$  two hours prior to test. HOLD Furosemide/Hydrochlorothiazide morning of the test.        After the Test: Drink plenty of water. After receiving IV contrast, you may experience a mild flushed feeling. This is normal. On occasion, you may experience a mild rash up to 24 hours after the test. This is not dangerous. If this occurs, you can take Benadryl 25 mg and increase your fluid intake. If you experience trouble breathing, this can be serious. If it is severe call 911 IMMEDIATELY. If it is mild, please call our office. If you take any of these medications: Glipizide/Metformin, Avandament, Glucavance, please do not take 48 hours after completing test unless otherwise instructed.  We will call to schedule your test 2-4 weeks out understanding that some insurance companies will need an authorization prior to the service being performed.   For non-scheduling related questions, please contact the cardiac imaging nurse navigator should you have any questions/concerns: Marchia Bond, Cardiac Imaging Nurse Navigator Gordy Clement, Cardiac Imaging Nurse Navigator [facility] Direct Office Dial: [phone]   For scheduling needs, including cancellations and rescheduling, please call [place], [phone].

## 2022-05-07 NOTE — Progress Notes (Signed)
Cardiology Office Note    Date:  05/16/2022   ID:  Jesse [facility], DOB 10-18-1975, MRN [id number]  PCP:  Jesse Carol, MD  Cardiologist:  Jesse Majestic, MD   New cardiology consultation referred by Dr. Seward Row[facility] for evaluation of chest pain.     History of Present Illness:  Jesse Rowe is a 46 y.o. male who is followed by Dr. Delfina Rowe for primary care.  He has a history of hyperlipidemia as well as obstructive sleep apnea.  He was recently evaluated at [facility] by Dr. Leeroy Rowe on April 07, 2022.  At that time, he complained of intermittent chest pain for several days.  Patient states he has experienced left-sided chest tightness which would extend to his left shoulder.  Typically it has been nonexertional.  He described it as a discomfort that would last several minutes and ultimately resolved.  In 2019 he had undergone chest surgery by Dr. 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  He is concerned that this tumor may be coming back.  He apparently has a history of obstructive sleep apnea but does not use CPAP which caused recurrent sinus infections.  He was followed previously by Dr. Jodi [facility]e.  laboratory on October 30 showed an increase CPK value at 367.  Troponin T was 8 which was normal.  CBC was stable with hemoglobin/hematocrit 14.5/43.2.  He has a history of gout for which she has been on allopurinol.  For the past year and a half he has been on low-dose statin therapy with rosuvastatin 10 mg.  At times he notes some intermittent wheezing for which she takes montelukast.  With his recent chest pain development, he was referred by Dr. Lina [facility] office for cardiology consultation.   Past Medical History:  Diagnosis Date   Asthma    History of multiple allergies    Mediastinal mass     Past Surgical History:  Procedure Laterality Date   RESECTION OF MEDIASTINAL MASS N/A  10/20/2017   Procedure: RESECTION OF MEDIASTINAL MASS;  Surgeon: Jesse Poot, MD;  Location: [place];  Service: Thoracic;  Laterality: N/A;   VIDEO ASSISTED THORACOSCOPY Left 10/20/2017   Procedure: VIDEO ASSISTED THORACOSCOPY;  Surgeon: Jesse Rowe, Jesse [place], MD;  Location: [facility] OR;  Service: Thoracic;  Laterality: Left;    Current Medications: Outpatient Medications Prior to Visit  Medication Sig Dispense Refill   acetaminophen (TYLENOL) 500 MG tablet Take 2 tablets (1,000 mg total) by mouth every 6 (six) hours. 30 tablet 0   albuterol (PROVENTIL HFA;VENTOLIN HFA) 108 (90 BASE) MCG/ACT inhaler Inhale 2 puffs into the lungs every 6 (six) hours as needed. For shortness of breath.     ALLOPURINOL PO Take by mouth.     budesonide (RHINOCORT ALLERGY) 32 MCG/ACT nasal spray Place 1 spray into both nostrils every other day. Alternate with azelastine     cetirizine (ZYRTEC) 10 MG tablet Take 10 mg by mouth daily.     EPINEPHrine (EPIPEN 2-PAK IJ) Inject 1 Syringe as directed as needed. For allergic reaction     montelukast (SINGULAIR) 10 MG tablet Take 10 mg by mouth at bedtime as needed (wheezing).      Multiple Vitamin (MULTIVITAMIN WITH MINERALS) TABS tablet Take 1 tablet by mouth daily.     rosuvastatin (CRESTOR) 10 MG tablet Take 10 mg by mouth daily.     No facility-administered  medications prior to visit.     Allergies:   Patient has no known allergies.   Social History   Socioeconomic History   Marital status: Married    Spouse name: Not on file   Number of children: Not on file   Years of education: Not on file   Highest education level: Not on file  Occupational History   Not on file  Tobacco Use   Smoking status: Never   Smokeless tobacco: Never  Vaping Use   Vaping Use: Never used  Substance and Sexual Activity   Alcohol use: Yes    Comment: occasional   Drug use: Never   Sexual activity: Not on file  Other Topics Concern   Not on file  Social History Narrative   Not on  file   Social Determinants of Health   Financial Resource Strain: Not on file  Food Insecurity: Not on file  Transportation Needs: Not on file  Physical Activity: Not on file  Stress: Not on file  Social Connections: Not on file     Socially, he was born in in [place] and lived in [place].  He is married for 15 years.  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  Family History:  The patient's family history includes CVA in his mother; Colon cancer in his mother; Diabetes in his mother; Hyperlipidemia in his father; Hypertension in his father.  His mother died at age 31 and had multiple strokes.  Father is living at 31 years old.  He has a sister age 81 who is alive.  His 3 children are 17, 12, and 2.  ROS General: Negative; No fevers, chills, or night sweats;  HEENT: Negative; No changes in vision or hearing, sinus congestion, difficulty swallowing Pulmonary: Remote history of schwannoma status post resection Cardiovascular: Negative; No chest pain, presyncope, syncope, palpitations GI: Negative; No nausea, vomiting, diarrhea, or abdominal pain GU: Negative; No dysuria, hematuria, or difficulty voiding Musculoskeletal: Negative; no myalgias, joint pain, or weakness Hematologic/Oncology: Negative; no easy bruising, bleeding Endocrine: Negative; no heat/cold intolerance; no diabetes Neuro: Negative; no changes in balance, headaches Skin: Negative; No rashes or skin lesions Psychiatric: Negative; No behavioral problems, depression Sleep: Untreated sleep apnea. No snoring, daytime sleepiness, hypersomnolence, bruxism, restless legs, hypnogognic hallucinations, no cataplexy Other comprehensive 14 point system review is negative.   PHYSICAL EXAM:   VS:  BP 136/84   Pulse 76   Ht _0  (1.778 m)   Wt 234 lb 6.4 oz (106.3 kg)   SpO2 96%   BMI 33.63 kg/m     Repeat BP by me: 116/78  Wt Readings from Last 3 Encounters:  05/07/22 234 lb 6.4 oz (106.3 kg)  12/08/18 235 lb  (106.6 kg)  05/13/18 223 lb (101.2 kg)    General: Alert, oriented, no distress.  Skin: normal turgor, no rashes, warm and dry HEENT: Normocephalic, atraumatic. Pupils equal round and reactive to light; sclera anicteric; extraocular muscles intact;  Nose without nasal septal hypertrophy Mouth/Parynx benign; Mallinpatti scale 3 Neck: No JVD, no carotid bruits; normal carotid upstroke Lungs: clear to ausculatation and percussion; no wheezing or rales Chest wall: without tenderness to palpitation Heart: PMI not displaced, RRR, s1 s2 normal, 1/6 systolic murmur, no diastolic murmur, no rubs, gallops, thrills, or heaves Abdomen: soft, nontender; no hepatosplenomehaly, BS+; abdominal aorta nontender and not dilated by palpation. Back: no CVA tenderness Pulses 2+ Musculoskeletal: full range of motion, normal strength, no joint deformities Extremities: no clubbing cyanosis or edema,  Homan's sign negative  Neurologic: grossly nonfocal; Cranial nerves grossly wnl Psychologic: Normal mood and affect   Studies/Labs Reviewed:   May 07, 2022 ECG (independently read by me): NSR at 76, no ectopy  Recent Labs:    Latest Ref Rng & Units 05/13/2022   10:48 AM 10/22/2017    3:54 AM 10/21/2017    9:52 AM  BMP  Glucose 70 - 99 mg/dL 91  108  108   BUN 6 - 24 mg/dL _0 Creatinine 0.76 - 1.27 mg/dL 1.16  1.15  0.94   BUN/Creat Ratio 9 - 20 9     Sodium 134 - 144 mmol/L 142  141  139   Potassium 3.5 - 5.2 mmol/L 4.3  3.7  3.8   Chloride 96 - 106 mmol/L 103  103  105   CO2 20 - 29 mmol/L _1 Calcium 8.7 - 10.2 mg/dL 10.0  8.7  8.9         Latest Ref Rng & Units 05/13/2022   10:48 AM 10/22/2017    3:54 AM 10/19/2017   10:12 AM  Hepatic Function  Total Protein 6.0 - 8.5 g/dL 7.3  5.9  7.2   Albumin 4.1 - 5.1 g/dL 4.7  3.3  4.2   AST 0 - 40 IU/L _2 ALT 0 - 44 IU/L _3 Alk Phosphatase 44 - 121 IU/L 69  38  45   Total Bilirubin 0.0 - 1.2 mg/dL 0.8  0.7  0.9         Latest Ref Rng & Units 05/13/2022   10:48 AM 10/22/2017    3:54 AM 10/21/2017    9:52 AM  CBC  WBC 3.4 - 10.8 x10E3/uL 7.7  10.5  12.0   Hemoglobin 13.0 - 17.7 g/dL 15.2  13.0  12.7   Hematocrit 37.5 - 51.0 % 44.6  39.8  38.0   Platelets 150 - 450 x10E3/uL 219  193  195    Lab Results  Component Value Date   MCV 89 05/13/2022   MCV 89.8 10/22/2017   MCV 89.6 10/21/2017   No results found for: "TSH" No results found for: "HGBA1C"   BNP No results found for: "BNP"  ProBNP No results found for: "PROBNP"   Lipid Panel     Component Value Date/Time   CHOL 173 05/13/2022 1048   TRIG 122 05/13/2022 1048   HDL 33 (L) 05/13/2022 1048   CHOLHDL 5.2 (H) 05/13/2022 1048   LDLCALC 118 (H) 05/13/2022 1048   LABVLDL 22 05/13/2022 1048     RADIOLOGY: No results found.   Additional studies/ records that were reviewed today include:   I reviewed the records of [facility] physicians.  I reviewed the records of Dr. Nils Pyle at follow-up of his schwannoma resection   ASSESSMENT:    1. Precordial pain   2. History of mediastinal mass: Schwannoma resection   3. OSA (obstructive sleep apnea)   4. Pure hypercholesterolemia     PLAN:  Mr. Celedonio Miyamoto is a 46 year old African-American gentleman who had undergone left VATS resection of a left apical 5 cm schwannoma by [name].  He had been on anticoagulation but ultimately was discontinued and when last seen by Dr. Prescott [facility] in July 2020 his neuritic pain had resolved and there were no pulmonary symptoms.  Recently, Mr. Tora Perches has noticed discomfort on the left  side of his chest which has occurred intermittently but over the past 3 to 4 weeks was increasing in frequency and intensity.  There often is pain radiation to his left shoulder.  He specifically denies any exertional symptomatology.  His ECG is unremarkable and shows sinus rhythm at 76 without ST-T abnormalities.  My suspicion is that this is most likely  noncardiac chest pain and probable musculoskeletal etiology.  He has a history of gout for which he takes allopurinol.  He has a history of hyperlipidemia and has been on low-dose rosuvastatin for approximately 1-1/2 years.  Laboratory on December 20, 2021 showed significant lipid elevation with total cholesterol 221, LDL cholesterol 155, triglycerides 153, and HDL at 38.  I am recommending he undergo fasting laboratory with a comprehensive metabolic panel, CBC, fasting lipid studies and LP(a).  Most recent TSH was normal at 1.38.  I am scheduling him to undergo a 2D echo Doppler study to evaluate systolic and diastolic function as well as valvular architecture.  With his chest pain and significant hyperlipidemia I am also recommending he undergo coronary CTA for assessment of coronary calcification and coronary luminal stenosis.  I will contact him regarding the results of the above studies and see him in 6 weeks for follow-up evaluation or sooner as needed.   Medication Adjustments/Labs and Tests Ordered: Current medicines are reviewed at length with the patient today.  Concerns regarding medicines are outlined above.  Medication changes, Labs and Tests ordered today are listed in the Patient Instructions below. Patient Instructions  Medication Instructions:  Your physician recommends that you continue on your current medications as directed. Please refer to the Current Medication list given to you today.  *If you need a refill on your cardiac medications before your next appointment, please call your pharmacy*   Lab Work: Your physician recommends that you return for lab work in: prior to coronary CTA- FASTING CMET, CBC, Lipids & LP(a)  If you have labs (blood work) drawn today and your tests are completely normal, you will receive your results only by: [place] (if you have MyChart) OR A paper copy in the mail If you have any lab test that is abnormal or we need to change your treatment, we  will call you to review the results.   Testing/Procedures: Your physician has requested that you have an echocardiogram. Echocardiography is a painless test that uses sound waves to create images of your heart. It provides your doctor with information about the size and shape of your heart and how well your heart's chambers and valves are working. This procedure takes approximately one hour. There are no restrictions for this procedure. Please do NOT wear cologne, perfume, aftershave, or lotions (deodorant is allowed). Please arrive 15 minutes prior to your appointment time. This procedure will be done at [address]   Follow-Up: At [facility], you and your health needs are our priority.  As part of our continuing mission to provide you with exceptional heart care, we have created designated Provider Care Teams.  These Care Teams include your primary Cardiologist (physician) and Advanced Practice Providers (APPs -  Physician Assistants and Nurse Practitioners) who all work together to provide you with the care you need, when you need it.  We recommend signing up for the patient portal called "MyChart".  Sign up information is provided on this After Visit Summary.  MyChart is used to connect with patients for Virtual Visits (Telemedicine).  Patients are able  to view lab/test results, encounter notes, upcoming appointments, etc.  Non-urgent messages can be sent to your provider as well.   To learn more about what you can do with MyChart, go to [email or web address].    Your next appointment:   6 week(s)  The format for your next appointment:   In Person  Provider:   Shelva Majestic, MD   Other Instructions   Your cardiac CT will be scheduled at the below location:   [facility] [address] [place], [place] [address] [phone]   If scheduled at [facility], please arrive at the [facility] and Children's Entrance (Entrance C2) of  [facility] 30 minutes prior to test start time. You can use the FREE valet parking offered at entrance C (encouraged to control the heart rate for the test)  Proceed to the [facility] Radiology Department (first floor) to check-in and test prep.  All radiology patients and guests should use entrance C2 at [facility], accessed from [facility], even though the hospital's physical address listed is [address].     Please follow these instructions carefully (unless otherwise directed):  Hold all erectile dysfunction medications at least 3 days (72 hrs) prior to test. (Ie viagra, cialis, sildenafil, tadalafil, etc) We will administer nitroglycerin during this exam.   On the Night Before the Test: Be sure to Drink plenty of water. Do not consume any caffeinated/decaffeinated beverages or chocolate 12 hours prior to your test. Do not take any antihistamines 12 hours prior to your test.  On the Day of the Test: Drink plenty of water until 1 hour prior to the test. Do not eat any food 1 hour prior to test. You may take your regular medications prior to the test.  Take metoprolol (Lopressor) 132m two hours prior to test. HOLD Furosemide/Hydrochlorothiazide morning of the test.       After the Test: Drink plenty of water. After receiving IV contrast, you may experience a mild flushed feeling. This is normal. On occasion, you may experience a mild rash up to 24 hours after the test. This is not dangerous. If this occurs, you can take Benadryl 25 mg and increase your fluid intake. If you experience trouble breathing, this can be serious. If it is severe call 911 IMMEDIATELY. If it is mild, please call our office. If you take any of these medications: Glipizide/Metformin, Avandament, Glucavance, please do not take 48 hours after completing test unless otherwise instructed.  We will call to schedule your test 2-4 weeks out understanding that some  insurance companies will need an authorization prior to the service being performed.   For non-scheduling related questions, please contact the cardiac imaging nurse navigator should you have any questions/concerns: SMarchia Bond Cardiac Imaging Nurse Navigator MGordy Clement Cardiac Imaging Nurse Navigator [facility] Direct Office Dial: [phone]  For scheduling needs, including cancellations and rescheduling, please call [place] [phone]    Signed, TShelva Majestic MD  05/16/2022 2:40 PM    [facility] [address] [place]  [address]Phone: [phone]

## 2022-05-13 LAB — COMPREHENSIVE METABOLIC PANEL
ALT: 19 IU/L (ref 0–44)
Albumin/Globulin Ratio: 1.8 (ref 1.2–2.2)
Alkaline Phosphatase: 69 IU/L (ref 44–121)
BUN: 10 mg/dL (ref 6–24)
Bilirubin Total: 0.8 mg/dL (ref 0.0–1.2)
eGFR: 79 mL/min/{1.73_m2} (ref >59)

## 2022-05-13 LAB — CBC

## 2022-05-13 LAB — LIPID PANEL
Cholesterol, Total: 173 mg/dL (ref 100–199)
HDL: 33 mg/dL — ABNORMAL LOW (ref >39)

## 2022-05-14 ENCOUNTER — Ambulatory Visit: Payer: BC Managed Care – PPO | Admitting: Podiatry

## 2022-05-14 ENCOUNTER — Encounter: Payer: Self-pay | Admitting: Podiatry

## 2022-05-14 VITALS — BP 146/83 | HR 100

## 2022-05-14 DIAGNOSIS — B351 Tinea unguium: Secondary | ICD-10-CM | POA: Diagnosis not present

## 2022-05-14 LAB — COMPREHENSIVE METABOLIC PANEL
AST: 19 IU/L (ref 0–40)
Albumin: 4.7 g/dL (ref 4.1–5.1)
BUN/Creatinine Ratio: 9 (ref 9–20)
CO2: 24 mmol/L (ref 20–29)
Calcium: 10 mg/dL (ref 8.7–10.2)
Chloride: 103 mmol/L (ref 96–106)
Creatinine, Ser: 1.16 mg/dL (ref 0.76–1.27)
Globulin, Total: 2.6 g/dL (ref 1.5–4.5)
Glucose: 91 mg/dL (ref 70–99)
Potassium: 4.3 mmol/L (ref 3.5–5.2)
Sodium: 142 mmol/L (ref 134–144)
Total Protein: 7.3 g/dL (ref 6.0–8.5)

## 2022-05-14 LAB — LIPOPROTEIN A (LPA): Lipoprotein (a): 39 nmol/L (ref <75.0)

## 2022-05-14 LAB — CBC
Hemoglobin: 15.2 g/dL (ref 13.0–17.7)
MCV: 89 fL (ref 79–97)
WBC: 7.7 10*3/uL (ref 3.4–10.8)

## 2022-05-14 LAB — LIPID PANEL
Chol/HDL Ratio: 5.2 ratio — ABNORMAL HIGH (ref 0.0–5.0)
LDL Chol Calc (NIH): 118 mg/dL — ABNORMAL HIGH (ref 0–99)
Triglycerides: 122 mg/dL (ref 0–149)
VLDL Cholesterol Cal: 22 mg/dL (ref 5–40)

## 2022-05-14 MED ORDER — TERBINAFINE HCL 250 MG PO TABS: 250.0000 mg | ORAL_TABLET | Freq: Every day | ORAL | 0 refills | Status: AC

## 2022-05-14 NOTE — Progress Notes (Signed)
   Chief Complaint  Patient presents with   Nail Problem    Bilateral toe nail fungus,great toes, the patient states that he has had the fungus for 10 years.    Subjective: 46 y.o. male presenting today as a new patient for evaluation of discoloration with thickening to the bilateral great toenails.  Patient states that he had been evaluated in the past and prescription for Lamisil was prescribed but he never took the pills.  He has had the toenail fungus for about 10 years now.  He has tried multiple topical antifungals with no improvement  Past Medical History:  Diagnosis Date   Asthma    History of multiple allergies    Mediastinal mass     Past Surgical History:  Procedure Laterality Date   RESECTION OF MEDIASTINAL MASS N/A 10/20/2017   Procedure: RESECTION OF MEDIASTINAL MASS;  Surgeon: Ivin Poot, MD;  Location: [place];  Service: Thoracic;  Laterality: N/A;   VIDEO ASSISTED THORACOSCOPY Left 10/20/2017   Procedure: VIDEO ASSISTED THORACOSCOPY;  Surgeon: Ivin Poot, MD;  Location: [facility];  Service: Thoracic;  Laterality: Left;    No Known Allergies  Objective: Physical Exam General: The patient is alert and oriented x3 in no acute distress.  Dermatology: Hyperkeratotic, discolored, thickened, onychodystrophy noted to the bilateral great toenails. Skin is warm, dry and supple bilateral lower extremities. Negative for open lesions or macerations.  Vascular: Palpable pedal pulses bilaterally. No edema or erythema noted. Capillary refill within normal limits.  Neurological: Epicritic and protective threshold grossly intact bilaterally.   Musculoskeletal Exam: No pedal deformity noted  Assessment: #1 Onychomycosis of toenails bilateral great toenails  Plan of Care:  #1 Patient was evaluated. #2  Today we discussed different treatment options including oral, topical, and laser antifungal treatment modalities.  We discussed their efficacies and side effects.  Patient  opts for oral antifungal treatment modality #3 prescription for Lamisil 250 mg #90 daily. Pt denies a history of liver pathology or symptoms.  Patient is otherwise healthy #4 return to clinic 6 months   [name], DPM [facility]  Dr. [name], DPM    [address], [place] [address]                Office [phone]  Fax [phone]

## 2022-05-20 ENCOUNTER — Telehealth (HOSPITAL_COMMUNITY): Payer: Self-pay | Admitting: *Deleted

## 2022-05-20 NOTE — Telephone Encounter (Signed)
Reaching out to patient to offer assistance regarding upcoming cardiac imaging study; pt verbalizes understanding of appt date/time, parking situation and where to check in, pre-test NPO status and medications ordered, and verified current allergies; name and call back number provided for further questions should they arise  Thanh Mottern RN Navigator Cardiac Imaging [facility] [phone] office [phone] cell  Patient to take 100mg metoprolol tartrate two hours prior to his cardiac CT scan. He is aware to arrive at 8am. 

## 2022-05-21 ENCOUNTER — Ambulatory Visit (HOSPITAL_COMMUNITY)
Admission: RE | Admit: 2022-05-21 | Discharge: 2022-05-21 | Disposition: A | Discharge status: Home / Self Care | Payer: BC Managed Care – PPO | Source: Ambulatory Visit | Attending: Cardiovascular Disease | Admitting: Cardiovascular Disease

## 2022-05-21 DIAGNOSIS — R072 Precordial pain: Secondary | ICD-10-CM | POA: Diagnosis not present

## 2022-05-21 MED ORDER — NITROGLYCERIN 0.4 MG SL SUBL
SUBLINGUAL_TABLET | SUBLINGUAL | Status: AC
  Filled 2022-05-21: qty 2

## 2022-05-21 MED ORDER — NITROGLYCERIN 0.4 MG SL SUBL
0.8000 mg | SUBLINGUAL_TABLET | Freq: Once | SUBLINGUAL | Status: AC
  Administered 2022-05-21: 0.8 mg via SUBLINGUAL

## 2022-05-21 MED ORDER — IOHEXOL 350 MG/ML SOLN
95.0000 mL | Freq: Once | INTRAVENOUS | Status: AC | PRN
  Administered 2022-05-21: 95 mL via INTRAVENOUS

## 2022-05-27 ENCOUNTER — Other Ambulatory Visit: Payer: Self-pay

## 2022-05-27 MED ORDER — ROSUVASTATIN CALCIUM 20 MG PO TABS: 20.0000 mg | ORAL_TABLET | Freq: Every day | ORAL | 3 refills | Status: AC

## 2022-05-29 ENCOUNTER — Ambulatory Visit (HOSPITAL_COMMUNITY): Payer: BC Managed Care – PPO | Attending: Cardiovascular Disease

## 2022-05-29 DIAGNOSIS — R072 Precordial pain: Secondary | ICD-10-CM | POA: Diagnosis present

## 2022-05-29 LAB — ECHOCARDIOGRAM COMPLETE
Area-P 1/2: 4.89 cm2
S' Lateral: 3.1 cm

## 2022-06-11 NOTE — Progress Notes (Incomplete)
Cardiology Office Note    Date:  06/11/2022   ID:  Jesse Rowe, DOB [birth year], MRN [id number]  PCP:  Seward Carol, MD  Cardiologist:  Shelva Majestic, MD   6-week follow-up cardiology evaluation initially referred by Dr. Seward Carol for evaluation of chest pain.     History of Present Illness:  Jesse Rowe is a 47 y.o. male who is followed by Dr. Delfina Redwood for primary care.  He has a history of hyperlipidemia as well as obstructive sleep apnea.  He was recently evaluated at [facility] by Dr. Leeroy Cha on April 07, 2022.  At that time, he complained of intermittent chest pain for several days.  Patient states he has experienced left-sided chest tightness which would extend to his left shoulder.  Typically it has been nonexertional.  He described it as a discomfort that would last several minutes and ultimately resolved.  In 2019 he had undergone chest surgery by Dr. 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  He is concerned that this tumor may be coming back.  He apparently has a history of obstructive sleep apnea but does not use CPAP which caused recurrent sinus infections.  He was followed previously by Dr. Jodi Marble.  laboratory on October 30 showed an increase CPK value at 367.  Troponin T was 8 which was normal.  CBC was stable with hemoglobin/hematocrit 14.5/43.2.  He has a history of gout for which she has been on allopurinol.  For the past year and a half he has been on low-dose statin therapy with rosuvastatin 10 mg.  At times he notes some intermittent wheezing for which she takes montelukast.  With his recent chest pain development, he was referred by Dr. Lina Sar office for cardiology consultation.   Past Medical History:  Diagnosis Date   Asthma    History of multiple allergies    Mediastinal mass     Past Surgical History:  Procedure Laterality Date   RESECTION OF  MEDIASTINAL MASS N/A 10/20/2017   Procedure: RESECTION OF MEDIASTINAL MASS;  Surgeon: Ivin Poot, MD;  Location: [facility];  Service: Thoracic;  Laterality: N/A;   VIDEO ASSISTED THORACOSCOPY Left 10/20/2017   Procedure: VIDEO ASSISTED THORACOSCOPY;  Surgeon: Prescott Gum, Collier Salina, MD;  Location: [facility] OR;  Service: Thoracic;  Laterality: Left;    Current Medications: Outpatient Medications Prior to Visit  Medication Sig Dispense Refill   acetaminophen (TYLENOL) 500 MG tablet Take 2 tablets (1,000 mg total) by mouth every 6 (six) hours. 30 tablet 0   albuterol (PROVENTIL HFA;VENTOLIN HFA) 108 (90 BASE) MCG/ACT inhaler Inhale 2 puffs into the lungs every 6 (six) hours as needed. For shortness of breath.     ALLOPURINOL PO Take by mouth.     budesonide (RHINOCORT ALLERGY) 32 MCG/ACT nasal spray Place 1 spray into both nostrils every other day. Alternate with azelastine     cetirizine (ZYRTEC) 10 MG tablet Take 10 mg by mouth daily.     EPINEPHrine (EPIPEN 2-PAK IJ) Inject 1 Syringe as directed as needed. For allergic reaction     metoprolol tartrate (LOPRESSOR) 100 MG tablet Take 1 tablet (100 mg total) by mouth once for 1 dose. Take 2 hours prior to procedure. 1 tablet 0   montelukast (SINGULAIR) 10 MG tablet Take 10 mg by mouth at bedtime as needed (wheezing).      Multiple Vitamin (MULTIVITAMIN  WITH MINERALS) TABS tablet Take 1 tablet by mouth daily.     rosuvastatin (CRESTOR) 20 MG tablet Take 1 tablet (20 mg total) by mouth daily. 90 tablet 3   terbinafine (LAMISIL) 250 MG tablet Take 1 tablet (250 mg total) by mouth daily. 90 tablet 0   No facility-administered medications prior to visit.     Allergies:   Patient has no known allergies.   Social History   Socioeconomic History   Marital status: Married    Spouse name: Not on file   Number of children: Not on file   Years of education: Not on file   Highest education level: Not on file  Occupational History   Not on file  Tobacco Use    Smoking status: Never   Smokeless tobacco: Never  Vaping Use   Vaping Use: Never used  Substance and Sexual Activity   Alcohol use: Yes    Comment: occasional   Drug use: Never   Sexual activity: Not on file  Other Topics Concern   Not on file  Social History Narrative   Not on file   Social Determinants of Health   Financial Resource Strain: Not on file  Food Insecurity: Not on file  Transportation Needs: Not on file  Physical Activity: Not on file  Stress: Not on file  Social Connections: Not on file     Socially, he was born in in [place] and lived in [place].  He is married for 15 years.  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  Family History:  The patient's family history includes CVA in his mother; Colon cancer in his mother; Diabetes in his mother; Hyperlipidemia in his father; Hypertension in his father.  His mother died at age 37 and had multiple strokes.  Father is living at 20 years old.  He has a sister age 60 who is alive.  His 3 children are 17, 12, and 2.  ROS General: Negative; No fevers, chills, or night sweats;  HEENT: Negative; No changes in vision or hearing, sinus congestion, difficulty swallowing Pulmonary: Remote history of schwannoma status post resection Cardiovascular: Negative; No chest pain, presyncope, syncope, palpitations GI: Negative; No nausea, vomiting, diarrhea, or abdominal pain GU: Negative; No dysuria, hematuria, or difficulty voiding Musculoskeletal: Negative; no myalgias, joint pain, or weakness Hematologic/Oncology: Negative; no easy bruising, bleeding Endocrine: Negative; no heat/cold intolerance; no diabetes Neuro: Negative; no changes in balance, headaches Skin: Negative; No rashes or skin lesions Psychiatric: Negative; No behavioral problems, depression Sleep: Untreated sleep apnea. No snoring, daytime sleepiness, hypersomnolence, bruxism, restless legs, hypnogognic hallucinations, no cataplexy Other comprehensive 14  point system review is negative.   PHYSICAL EXAM:   VS:  There were no vitals taken for this visit.    Repeat BP by me: 116/78  Wt Readings from Last 3 Encounters:  05/07/22 234 lb 6.4 oz (106.3 kg)  12/08/18 235 lb (106.6 kg)  05/13/18 223 lb (101.2 kg)      Physical Exam There were no vitals taken for this visit. General: Alert, oriented, no distress.  Skin: normal turgor, no rashes, warm and dry HEENT: Normocephalic, atraumatic. Pupils equal round and reactive to light; sclera anicteric; extraocular muscles intact; Fundi ** Nose without nasal septal hypertrophy Mouth/Parynx benign; Mallinpatti scale Neck: No JVD, no carotid bruits; normal carotid upstroke Lungs: clear to ausculatation and percussion; no wheezing or rales Chest wall: without tenderness to palpitation Heart: PMI not displaced, RRR, s1 s2 normal, 1/6 systolic murmur, no diastolic murmur,  no rubs, gallops, thrills, or heaves Abdomen: soft, nontender; no hepatosplenomehaly, BS+; abdominal aorta nontender and not dilated by palpation. Back: no CVA tenderness Pulses 2+ Musculoskeletal: full range of motion, normal strength, no joint deformities Extremities: no clubbing cyanosis or edema, Homan's sign negative  Neurologic: grossly nonfocal; Cranial nerves grossly wnl Psychologic: Normal mood and affect    General: Alert, oriented, no distress.  Skin: normal turgor, no rashes, warm and dry HEENT: Normocephalic, atraumatic. Pupils equal round and reactive to light; sclera anicteric; extraocular muscles intact;  Nose without nasal septal hypertrophy Mouth/Parynx benign; Mallinpatti scale 3 Neck: No JVD, no carotid bruits; normal carotid upstroke Lungs: clear to ausculatation and percussion; no wheezing or rales Chest wall: without tenderness to palpitation Heart: PMI not displaced, RRR, s1 s2 normal, 1/6 systolic murmur, no diastolic murmur, no rubs, gallops, thrills, or heaves Abdomen: soft, nontender; no  hepatosplenomehaly, BS+; abdominal aorta nontender and not dilated by palpation. Back: no CVA tenderness Pulses 2+ Musculoskeletal: full range of motion, normal strength, no joint deformities Extremities: no clubbing cyanosis or edema, Homan's sign negative  Neurologic: grossly nonfocal; Cranial nerves grossly wnl Psychologic: Normal mood and affect   Studies/Labs Reviewed:   June 16, 2022 ECG (independently read by me):   May 07, 2022 ECG (independently read by me): NSR at 76, no ectopy  Recent Labs:    Latest Ref Rng & Units 05/13/2022   10:48 AM 10/22/2017    3:54 AM 10/21/2017    9:52 AM  BMP  Glucose 70 - 99 mg/dL 91  108  108   BUN 6 - 24 mg/dL _0 Creatinine 0.76 - 1.27 mg/dL 1.16  1.15  0.94   BUN/Creat Ratio 9 - 20 9     Sodium 134 - 144 mmol/L 142  141  139   Potassium 3.5 - 5.2 mmol/L 4.3  3.7  3.8   Chloride 96 - 106 mmol/L 103  103  105   CO2 20 - 29 mmol/L _1 Calcium 8.7 - 10.2 mg/dL 10.0  8.7  8.9         Latest Ref Rng & Units 05/13/2022   10:48 AM 10/22/2017    3:54 AM 10/19/2017   10:12 AM  Hepatic Function  Total Protein 6.0 - 8.5 g/dL 7.3  5.9  7.2   Albumin 4.1 - 5.1 g/dL 4.7  3.3  4.2   AST 0 - 40 IU/L _2 ALT 0 - 44 IU/L _3 Alk Phosphatase 44 - 121 IU/L 69  38  45   Total Bilirubin 0.0 - 1.2 mg/dL 0.8  0.7  0.9        Latest Ref Rng & Units 05/13/2022   10:48 AM 10/22/2017    3:54 AM 10/21/2017    9:52 AM  CBC  WBC 3.4 - 10.8 x10E3/uL 7.7  10.5  12.0   Hemoglobin 13.0 - 17.7 g/dL 15.2  13.0  12.7   Hematocrit 37.5 - 51.0 % 44.6  39.8  38.0   Platelets 150 - 450 x10E3/uL 219  193  195    Lab Results  Component Value Date   MCV 89 05/13/2022   MCV 89.8 10/22/2017   MCV 89.6 10/21/2017   No results found for: "TSH" No results found for: "HGBA1C"   BNP No results found for: "BNP"  ProBNP No results found for: "PROBNP"  Lipid Panel     Component Value Date/Time   CHOL 173 05/13/2022  1048   TRIG 122 05/13/2022 1048   HDL 33 (L) 05/13/2022 1048   CHOLHDL 5.2 (H) 05/13/2022 1048   LDLCALC 118 (H) 05/13/2022 1048   LABVLDL 22 05/13/2022 1048     RADIOLOGY: ECHOCARDIOGRAM COMPLETE  Result Date: 05/29/2022    ECHOCARDIOGRAM REPORT   Patient Name:   KERRICK MILER Date of Exam: 05/29/2022 Medical Rec #:  [id number]        Height:       70.0 in Accession #:    [id number]       Weight:       234.4 lb Date of Birth:  [birth year]        BSA:          2.233 m Patient Age:    64 years         BP:           136/84 mmHg Patient Gender: M                HR:           71 bpm. Exam Location:  [facility] Procedure: 2D Echo, Cardiac Doppler and Color Doppler Indications:    R07.9* Chest pain, unspecified; E78.5 Hyperlipidemia  History:        Patient has prior history of Echocardiogram examinations. Risk                 Factors:Sleep Apnea. Asthma. Precordial chest pain.  Sonographer:    Diamond Nickel RCS Referring Phys: [place]  1. Left ventricular ejection fraction, by estimation, is 60 to 65%. The left ventricle has normal function. The left ventricle has no regional wall motion abnormalities. There is mild concentric left ventricular hypertrophy. Left ventricular diastolic parameters were normal.  2. Right ventricular systolic function is normal. The right ventricular size is normal. There is normal pulmonary artery systolic pressure.  3. The mitral valve is normal in structure. Trivial mitral valve regurgitation. No evidence of mitral stenosis.  4. The aortic valve is tricuspid. Aortic valve regurgitation is not visualized. No aortic stenosis is present.  5. The inferior vena cava is normal in size with greater than 50% respiratory variability, suggesting right atrial pressure of 3 mmHg. FINDINGS  Left Ventricle: Left ventricular ejection fraction, by estimation, is 60 to 65%. The left ventricle has normal function. The left ventricle has no regional wall motion  abnormalities. The left ventricular internal cavity size was normal in size. There is  mild concentric left ventricular hypertrophy. Left ventricular diastolic parameters were normal. Normal left ventricular filling pressure. Right Ventricle: The right ventricular size is normal. No increase in right ventricular wall thickness. Right ventricular systolic function is normal. There is normal pulmonary artery systolic pressure. The tricuspid regurgitant velocity is 1.77 m/s, and  with an assumed right atrial pressure of 3 mmHg, the estimated right ventricular systolic pressure is 75.1 mmHg. Left Atrium: Left atrial size was normal in size. Right Atrium: Right atrial size was normal in size. Pericardium: There is no evidence of pericardial effusion. Mitral Valve: The mitral valve is normal in structure. Trivial mitral valve regurgitation. No evidence of mitral valve stenosis. Tricuspid Valve: The tricuspid valve is normal in structure. Tricuspid valve regurgitation is trivial. No evidence of tricuspid stenosis. Aortic Valve: The aortic valve is tricuspid. Aortic valve regurgitation is not visualized. No aortic stenosis is present. Pulmonic Valve: The pulmonic valve  was normal in structure. Pulmonic valve regurgitation is mild. No evidence of pulmonic stenosis. Aorta: The aortic root is normal in size and structure. Venous: The inferior vena cava is normal in size with greater than 50% respiratory variability, suggesting right atrial pressure of 3 mmHg. IAS/Shunts: No atrial level shunt detected by color flow Doppler.  LEFT VENTRICLE PLAX 2D LVIDd:         5.00 cm   Diastology LVIDs:         3.10 cm   LV e' medial:    9.46 cm/s LV PW:         1.20 cm   LV E/e' medial:  7.0 LV IVS:        1.20 cm   LV e' lateral:   10.20 cm/s LVOT diam:     2.10 cm   LV E/e' lateral: 6.5 LV SV:         64 LV SV Index:   29 LVOT Area:     3.46 cm  RIGHT VENTRICLE RV Basal diam:  2.70 cm RV S prime:     11.50 cm/s TAPSE (M-mode): 2.1 cm  LEFT ATRIUM             Index        RIGHT ATRIUM          Index LA diam:        3.60 cm 1.61 cm/m   RA Area:     8.76 cm LA Vol (A2C):   20.5 ml 9.18 ml/m   RA Volume:   14.90 ml 6.67 ml/m LA Vol (A4C):   43.5 ml 19.48 ml/m LA Biplane Vol: 31.4 ml 14.06 ml/m  AORTIC VALVE             PULMONIC VALVE LVOT Vmax:   88.80 cm/s  PR End Diast Vel: 2.37 msec LVOT Vmean:  55.200 cm/s LVOT VTI:    0.186 m  AORTA Ao Root diam: 3.10 cm Ao Asc diam:  2.80 cm MITRAL VALVE               TRICUSPID VALVE MV Area (PHT): 4.89 cm    TR Peak grad:   12.5 mmHg MV Decel Time: 155 msec    TR Vmax:        177.00 cm/s MV E velocity: 66.10 cm/s MV A velocity: 53.80 cm/s  SHUNTS MV E/A ratio:  1.23        Systemic VTI:  0.19 m                            Systemic Diam: 2.10 cm Skeet Latch MD Electronically signed by Skeet Latch MD Signature Date/Time: 05/29/2022/2:44:33 PM    Final    CT CORONARY MORPH W/CTA COR W/SCORE Lewanda Rife W/CM &/OR WO/CM  Addendum Date: 05/21/2022   ADDENDUM REPORT: 05/21/2022 10:49 EXAM: OVER-READ INTERPRETATION  CT CHEST The following report is an over-read performed by radiologist Dr. Sabino Dick [facility] on 05/21/2022. This over-read does not include interpretation of cardiac or coronary anatomy or pathology. The coronary CTA interpretation by the cardiologist is attached. COMPARISON:  December 08, 2018. FINDINGS: The visualized portions of the extracardiac vascular structures are unremarkable. Visualized mediastinum is unremarkable. Visualized portion of upper abdomen is unremarkable. Visualized pulmonary parenchyma is unremarkable. Visualized skeleton is unremarkable. IMPRESSION: No definite abnormality seen involving the visualized extracardiac portions of the chest. Electronically Signed   By: Marijo Conception  M.D.   On: 05/21/2022 10:49   Result Date: 05/21/2022 CLINICAL DATA:  Chest pain EXAM: Cardiac CTA MEDICATIONS: Sub lingual nitro. 49m and lopressor 1059mTECHNIQUE: The  patient was scanned on a [id number] scanner. Gantry rotation speed was 250 msecs. Collimation was .6 mm. A 100 kV prospective scan was triggered in the ascending thoracic aorta at 140 HU's Full mA was used between 35% and 75% of the R-R interval. Average HR during the scan was 60 bpm. The 3D data set was interpreted on a dedicated work station using MPR, MIP and VRT modes. A total of 80cc of contrast was used. FINDINGS: Non-cardiac: [facility] No significant findings on limited lung and soft tissue windows. Calcium Score: No calcium noted Coronary Arteries: Right dominant with no anomalies LM: Normal LAD: Normal D1: Normal D2: Normal Circumflex: Normal OM1: Normal OM2: Normal RCA: Normal PDA: Normal PLA: Normal IMPRESSION: 1. Normal Right dominant coronary arteries 2.  Calcium score 0 3.  Normal ascending thoracic aorta 2.8 cm PeJenkins Rougelectronically Signed: By: PeJenkins Rouge.D. On: 05/21/2022 09:02     Additional studies/ records that were reviewed today include:   [facility]  I reviewed the records of Dr. VaNils Pylet follow-up of his schwannoma resection  Cardiac CTA: 05/21/2022 FINDINGS: Non-cardiac: [facility]. No significant findings on limited lung and soft tissue windows.   Calcium Score: No calcium noted   Coronary Arteries: Right dominant with no anomalies   LM: Normal   LAD: Normal   D1: Normal   D2: Normal   Circumflex: Normal   OM1: Normal   OM2: Normal   RCA: Normal   PDA: Normal   PLA: Normal   IMPRESSION: 1. Normal Right dominant coronary arteries   2.  Calcium score 0   3.  Normal ascending thoracic aorta 2.8 cm   ECHO: 05/29/2022 IMPRESSIONS   1. Left ventricular ejection fraction, by estimation, is 60 to 65%. The  left ventricle has normal function. The left ventricle has no regional  wall motion abnormalities. There is mild  concentric left ventricular  hypertrophy. Left ventricular diastolic  parameters were normal.   2. Right ventricular systolic function is normal. The right ventricular  size is normal. There is normal pulmonary artery systolic pressure.   3. The mitral valve is normal in structure. Trivial mitral valve  regurgitation. No evidence of mitral stenosis.   4. The aortic valve is tricuspid. Aortic valve regurgitation is not  visualized. No aortic stenosis is present.   5. The inferior vena cava is normal in size with greater than 50%  respiratory variability, suggesting right atrial pressure of 3 mmHg.    ASSESSMENT:    No diagnosis found.   PLAN:  Mr. FrCeledonio Miyamotos a [age]ear old African-American gentleman who had undergone left VATS resection of a left apical 5 cm schwannoma by Dr. VaDarcey Nora He had been on anticoagulation but ultimately was discontinued and when last seen by Dr. VaPrescott Gumn July 2020 his neuritic pain had resolved and there were no pulmonary symptoms.  Recently, Mr. SeTora Perchesas noticed discomfort on the left side of his chest which has occurred intermittently but over the past 3 to 4 weeks was increasing in frequency and intensity.  There often is pain radiation to his left shoulder.  He specifically denies any exertional symptomatology.  His ECG is unremarkable and shows sinus rhythm at  76 without ST-T abnormalities.  My suspicion is that this is most likely noncardiac chest pain and probable musculoskeletal etiology.  He has a history of gout for which he takes allopurinol.  He has a history of hyperlipidemia and has been on low-dose rosuvastatin for approximately 1-1/2 years.  Laboratory on December 20, 2021 showed significant lipid elevation with total cholesterol 221, LDL cholesterol 155, triglycerides 153, and HDL at 38.  I am recommending he undergo fasting laboratory with a comprehensive metabolic panel, CBC, fasting lipid studies and LP(a).  Most recent TSH was normal at  1.38.  I am scheduling him to undergo a 2D echo Doppler study to evaluate systolic and diastolic function as well as valvular architecture.  With his chest pain and significant hyperlipidemia I am also recommending he undergo coronary CTA for assessment of coronary calcification and coronary luminal stenosis.  I will contact him regarding the results of the above studies and see him in 6 weeks for follow-up evaluation or sooner as needed.   Medication Adjustments/Labs and Tests Ordered: Current medicines are reviewed at length with the patient today.  Concerns regarding medicines are outlined above.  Medication changes, Labs and Tests ordered today are listed in the Patient Instructions below. There are no Patient Instructions on file for this visit.   Signed, Shelva Majestic, MD  06/11/2022 4:09 PM    [facility] [address] [place], [place], [place]  [address] Phone: [phone]

## 2022-06-16 ENCOUNTER — Ambulatory Visit: Payer: BC Managed Care – PPO | Admitting: Cardiovascular Disease

## 2022-06-20 NOTE — Progress Notes (Unsigned)
Cardiology Clinic Note   Patient Name: Jesse Rowe Date of Encounter: 06/23/2022  Primary Care Provider:  Seward Carol, MD Primary Cardiologist:  Shelva Majestic, MD  Patient Profile    Jesse Rowe 47 year old male presents to the clinic today for follow-up evaluation of his precordial pain and hypercholesterolemia  Past Medical History    Past Medical History:  Diagnosis Date   Asthma    History of multiple allergies    Mediastinal mass    Past Surgical History:  Procedure Laterality Date   RESECTION OF MEDIASTINAL MASS N/A 10/20/2017   Procedure: RESECTION OF MEDIASTINAL MASS;  Surgeon: Ivin Poot, MD;  Location: [place];  Service: Thoracic;  Laterality: N/A;   VIDEO ASSISTED THORACOSCOPY Left 10/20/2017   Procedure: VIDEO ASSISTED THORACOSCOPY;  Surgeon: Ivin Poot, MD;  Location: [place];  Service: Thoracic;  Laterality: Left;    Allergies  No Known Allergies  History of Present Illness    Jesse Rowe has a PMH of chest pain, mediastinal mass status post Schwannoma resection, OSA, and hypercholesterolemia.  His echocardiogram 05/29/2022 showed an EF of 60-65% with mild concentric left ventricular hypertrophy and normal diastolic parameters.  He was not noted to have significant valvular abnormalities.  His LP(a) was 39 (reference range less than 75).  His CMP was unremarkable.  Coronary CTA 05/21/2022 showed coronary calcium score of 0.  He was seen by [name] on 05/07/2022.  He was referred by his PCP to be evaluated due to his history of hyperlipidemia and OSA.  He had also complained of intermittent episodes of chest pain for several days.  He described his discomfort as lasting for several minutes and it would ultimately resolved.  In 2019 he underwent chest surgery by [name] and was found to have a mediastinal mass which was resected and turned out to be a 5 cm schwannoma.  He was concerned that the tumor may be coming back.  He reported  compliance with his low-dose rosuvastatin.  His EKG at that time showed sinus rhythm 76 bpm without ST or T wave abnormalities.  It was felt that his discomfort was probable MSK etiology.  He presents to the clinic today for follow-up evaluation states he was concerned about his previous mediastinal mass reoccurring.  We reviewed his coronary CTA and echocardiogram.  He expressed understanding.  We reviewed the importance of high-fiber diet and maintaining physical activity.  His blood pressure today is 118/74 and his pulse is 86.  I will give him high-fiber diet instructions and plan follow-up in 1 year.  Today he denies chest pain, shortness of breath, lower extremity edema, fatigue, palpitations, melena, hematuria, hemoptysis, diaphoresis, weakness, presyncope, syncope, orthopnea, and PND.    Home Medications    Prior to Admission medications   Medication Sig Start Date End Date Taking? Authorizing Provider  acetaminophen (TYLENOL) 500 MG tablet Take 2 tablets (1,000 mg total) by mouth every 6 (six) hours. 10/24/17   Elgie Collard, PA-C  albuterol (PROVENTIL HFA;VENTOLIN HFA) 108 (90 BASE) MCG/ACT inhaler Inhale 2 puffs into the lungs every 6 (six) hours as needed. For shortness of breath.    [provider]  ALLOPURINOL PO Take by mouth.    [provider]  budesonide (RHINOCORT ALLERGY) 32 MCG/ACT nasal spray Place 1 spray into both nostrils every other day. Alternate with azelastine    [provider]  cetirizine (ZYRTEC) 10 MG tablet Take 10 mg by mouth daily.    [provider]  EPINEPHrine (EPIPEN 2-PAK IJ) Inject 1 Syringe as directed as needed. For allergic reaction    [provider]  metoprolol tartrate (LOPRESSOR) 100 MG tablet Take 1 tablet (100 mg total) by mouth once for 1 dose. Take 2 hours prior to procedure. 05/07/22 05/07/22  [name], MD  montelukast (SINGULAIR) 10 MG tablet Take 10 mg by mouth at bedtime as needed  (wheezing).     [provider]  Multiple Vitamin (MULTIVITAMIN WITH MINERALS) TABS tablet Take 1 tablet by mouth daily.    [provider]  rosuvastatin (CRESTOR) 20 MG tablet Take 1 tablet (20 mg total) by mouth daily. 05/27/22   [name], MD  terbinafine (LAMISIL) 250 MG tablet Take 1 tablet (250 mg total) by mouth daily. 05/14/22   [name], DPM    Family History    Family History  Problem Relation Age of Onset   Colon cancer Mother    CVA Mother    Diabetes Mother    Hypertension Father    Hyperlipidemia Father    He indicated that his mother is alive. He indicated that his father is alive. He indicated that his sister is alive. He indicated that his brother is deceased.  Social History    Social History   Socioeconomic History   Marital status: Single    Spouse name: Not on file   Number of children: Not on file   Years of education: Not on file   Highest education level: Not on file  Occupational History   Not on file  Tobacco Use   Smoking status: Never   Smokeless tobacco: Never  Vaping Use   Vaping Use: Never used  Substance and Sexual Activity   Alcohol use: Yes    Comment: occasional   Drug use: Never   Sexual activity: Not on file  Other Topics Concern   Not on file  Social History Narrative   Not on file   Social Determinants of Health   Financial Resource Strain: Not on file  Food Insecurity: Not on file  Transportation Needs: Not on file  Physical Activity: Not on file  Stress: Not on file  Social Connections: Not on file  Intimate Partner Violence: Not on file     Review of Systems    General:  No chills, fever, night sweats or weight changes.  Cardiovascular:  No chest pain, dyspnea on exertion, edema, orthopnea, palpitations, paroxysmal nocturnal dyspnea. Dermatological: No rash, lesions/masses Respiratory: No cough, dyspnea Urologic: No hematuria, dysuria Abdominal:   No nausea, vomiting, diarrhea, bright  red blood per rectum, melena, or hematemesis Neurologic:  No visual changes, wkns, changes in mental status. All other systems reviewed and are otherwise negative except as noted above.  Physical Exam    VS:  BP 118/74 (BP Location: Left Arm, Patient Position: Sitting, Cuff Size: Large)   Pulse 86   Ht '5\' 10"'$  (1.778 m)   Wt 240 lb (108.9 kg)   SpO2 96%   BMI 34.44 kg/m  , BMI Body mass index is 34.44 kg/m. GEN: Well nourished, well developed, in no acute distress. HEENT: normal. Neck: Supple, no JVD, carotid bruits, or masses. Cardiac: RRR, no murmurs, rubs, or gallops. No clubbing, cyanosis, edema.  Radials/DP/PT 2+ and equal bilaterally.  Respiratory:  Respirations regular and unlabored, clear to auscultation bilaterally. GI: Soft, nontender, nondistended, BS + x 4. MS: no deformity or atrophy. Skin: warm and dry, no rash. Neuro:  Strength and  sensation are intact. Psych: Normal affect.  Accessory Clinical Findings    Recent Labs: 05/13/2022: ALT 19; BUN 10; Creatinine, Ser 1.16; Hemoglobin 15.2; Platelets 219; Potassium 4.3; Sodium 142   Recent Lipid Panel    Component Value Date/Time   CHOL 173 05/13/2022 1048   TRIG 122 05/13/2022 1048   HDL 33 (L) 05/13/2022 1048   CHOLHDL 5.2 (H) 05/13/2022 1048   LDLCALC 118 (H) 05/13/2022 1048         ECG personally reviewed by me today-none today.  Echocardiogram 05/29/2022  IMPRESSIONS     1. Left ventricular ejection fraction, by estimation, is 60 to 65%. The  left ventricle has normal function. The left ventricle has no regional  wall motion abnormalities. There is mild concentric left ventricular  hypertrophy. Left ventricular diastolic  parameters were normal.   2. Right ventricular systolic function is normal. The right ventricular  size is normal. There is normal pulmonary artery systolic pressure.   3. The mitral valve is normal in structure. Trivial mitral valve  regurgitation. No evidence of mitral  stenosis.   4. The aortic valve is tricuspid. Aortic valve regurgitation is not  visualized. No aortic stenosis is present.   5. The inferior vena cava is normal in size with greater than 50%  respiratory variability, suggesting right atrial pressure of 3 mmHg.   FINDINGS   Left Ventricle: Left ventricular ejection fraction, by estimation, is 60  to 65%. The left ventricle has normal function. The left ventricle has no  regional wall motion abnormalities. The left ventricular internal cavity  size was normal in size. There is   mild concentric left ventricular hypertrophy. Left ventricular diastolic  parameters were normal. Normal left ventricular filling pressure.   Right Ventricle: The right ventricular size is normal. No increase in  right ventricular wall thickness. Right ventricular systolic function is  normal. There is normal pulmonary artery systolic pressure. The tricuspid  regurgitant velocity is 1.77 m/s, and   with an assumed right atrial pressure of 3 mmHg, the estimated right  ventricular systolic pressure is 93.7 mmHg.   Left Atrium: Left atrial size was normal in size.   Right Atrium: Right atrial size was normal in size.   Pericardium: There is no evidence of pericardial effusion.   Mitral Valve: The mitral valve is normal in structure. Trivial mitral  valve regurgitation. No evidence of mitral valve stenosis.   Tricuspid Valve: The tricuspid valve is normal in structure. Tricuspid  valve regurgitation is trivial. No evidence of tricuspid stenosis.   Aortic Valve: The aortic valve is tricuspid. Aortic valve regurgitation is  not visualized. No aortic stenosis is present.   Pulmonic Valve: The pulmonic valve was normal in structure. Pulmonic valve  regurgitation is mild. No evidence of pulmonic stenosis.   Aorta: The aortic root is normal in size and structure.   Venous: The inferior vena cava is normal in size with greater than 50%  respiratory variability,  suggesting right atrial pressure of 3 mmHg.   IAS/Shunts: No atrial level shunt detected by color flow Doppler.    Coronary CTA 05/21/2022  FINDINGS: Non-cardiac: See separate report from [facility]. No significant findings on limited lung and soft tissue windows.   Calcium Score: No calcium noted   Coronary Arteries: Right dominant with no anomalies   LM: Normal   LAD: Normal   D1: Normal   D2: Normal   Circumflex: Normal   OM1: Normal   OM2: Normal   RCA:  Normal   PDA: Normal   PLA: Normal   IMPRESSION: 1. Normal Right dominant coronary arteries   2.  Calcium score 0   3.  Normal ascending thoracic aorta 2.8 cm   Jenkins Rouge   Electronically Signed: By: Jenkins Rouge M.D. On: 05/21/2022 09:02  Assessment & Plan   1.  Chest discomfort-coronary CTA showed extracardiac abnormality.  Coronary calcium score 0.  Echocardiogram showed an EF of 60 to 65% with mild concentric LVH and normal diastolic parameters.  Appears to be related to musculoskeletal discomfort. Patient reassured that chest discomfort was not related to cardiac issues. No further testing planned at this time.  History of mediastinal mass-coronary CTA 05/21/2022 showed no extracardiac abnormalities.  No significant findings on limited lung and soft tissue windows.  OSA-not using caused sinus infection. Had sinus surgery after and now waking up well rested.  Avoid sedatives before bed, EtOH, supine sleeping Sleep hygiene  Hyperlipidemia-LDL 118 on 05/13/22 Continue rosuvastatin Heart healthy low-sodium diet-salty 6 given Increase physical activity as tolerated  Disposition: Follow-up with [name] or me in 1 year.   Jossie Ng. Kadince Boxley NP-C     06/23/2022, 9:12 AM [address] [address] Office [phone] Fax [phone]    I spent 14 minutes examining this patient, reviewing medications, and using patient centered shared  decision making involving her cardiac care.  Prior to her visit I spent greater than 20 minutes reviewing her past medical history,  medications, and prior cardiac tests.

## 2022-06-23 ENCOUNTER — Ambulatory Visit: Payer: BC Managed Care – PPO | Attending: General Practice | Admitting: General Practice

## 2022-06-23 ENCOUNTER — Encounter: Payer: Self-pay | Admitting: General Practice

## 2022-06-23 VITALS — BP 118/74 | HR 86 | Ht 70.0 in | Wt 240.0 lb

## 2022-06-23 DIAGNOSIS — J9859 Other diseases of mediastinum, not elsewhere classified: Secondary | ICD-10-CM | POA: Diagnosis not present

## 2022-06-23 DIAGNOSIS — E7800 Pure hypercholesterolemia, unspecified: Secondary | ICD-10-CM | POA: Diagnosis not present

## 2022-06-23 DIAGNOSIS — G4733 Obstructive sleep apnea (adult) (pediatric): Secondary | ICD-10-CM

## 2022-06-23 DIAGNOSIS — R0789 Other chest pain: Secondary | ICD-10-CM

## 2022-06-23 NOTE — Patient Instructions (Signed)
Medication Instructions:  The current medical regimen is effective;  continue present plan and medications as directed. Please refer to the Current Medication list given to you today.  *If you need a refill on your cardiac medications before your next appointment, please call your pharmacy*  Lab Work: NONE If you have labs (blood work) drawn today and your tests are completely normal, you will receive your results only by:  [place] (if you have MyChart) OR A paper copy in the mail  If you have any lab test that is abnormal or we need to change your treatment, we will call you to review the results.  Testing/Procedures: NONE  Follow-Up: At [facility], you and your health needs are our priority.  As part of our continuing mission to provide you with exceptional heart care, we have created designated Provider Care Teams.  These Care Teams include your primary Cardiologist (physician) and Advanced Practice Providers (APPs -  Physician Assistants and Nurse Practitioners) who all work together to provide you with the care you need, when you need it.  Your next appointment:   12 month(s)  Provider:   Shelva Majestic, MD     Other Instructions INCREASE FIBER IN YOUR DIET-SEE ATTACHED  STAY ACTIVE   High-Fiber Eating Plan Fiber, also called dietary fiber, is a type of carbohydrate. It is found foods such as fruits, vegetables, whole grains, and beans. A high-fiber diet can have many health benefits. Your health care provider may recommend a high-fiber diet to help: Prevent constipation. Fiber can make your bowel movements more regular. Lower your cholesterol. Relieve the following conditions: Inflammation of veins in the anus (hemorrhoids). Inflammation of specific areas of the digestive tract (uncomplicated diverticulosis). A problem of the large intestine, also called the colon, that sometimes causes pain and diarrhea (irritable bowel syndrome, or IBS). Prevent overeating  as part of a weight-loss plan. Prevent heart disease, type 2 diabetes, and certain cancers. What are tips for following this plan? Reading food labels  Check the nutrition facts label on food products for the amount of dietary fiber. Choose foods that have 5 grams of fiber or more per serving. The goals for recommended daily fiber intake include: Men (age 54 or younger): 34-38 g. Men (over age 37): 28-34 g. Women (age 84 or younger): 25-28 g. Women (over age 45): 22-25 g.  Shopping Choose whole fruits and vegetables instead of processed forms, such as apple juice or applesauce. Choose a wide variety of high-fiber foods such as avocados, lentils, oats, and kidney beans. Read the nutrition facts label of the foods you choose. Be aware of foods with added fiber. These foods often have high sugar and sodium amounts per serving. Cooking Use whole-grain flour for baking and cooking. Cook with brown rice instead of white rice. Meal planning Start the day with a breakfast that is high in fiber, such as a cereal that contains 5 g of fiber or more per serving. Eat breads and cereals that are made with whole-grain flour instead of refined flour or white flour. Eat brown rice, bulgur wheat, or millet instead of white rice. Use beans in place of meat in soups, salads, and pasta dishes. Be sure that half of the grains you eat each day are whole grains. General information You can get the recommended daily intake of dietary fiber by: Eating a variety of fruits, vegetables, grains, nuts, and beans. Taking a fiber supplement if you are not able to take in enough fiber in  your diet. It is better to get fiber through food than from a supplement. Gradually increase how much fiber you consume. If you increase your intake of dietary fiber too quickly, you may have bloating, cramping, or gas. Drink plenty of water to help you digest fiber. Choose high-fiber snacks, such as berries, raw vegetables, nuts, and  popcorn. What foods should I eat? Fruits Berries. Pears. Apples. Oranges. Avocado. Prunes and raisins. Dried figs. Vegetables Sweet potatoes. Spinach. Kale. Artichokes. Cabbage. Broccoli. Cauliflower. Green peas. Carrots. Squash. Grains Whole-grain breads. Multigrain cereal. Oats and oatmeal. Brown rice. Barley. Bulgur wheat. [place]. Quinoa. Bran muffins. Popcorn. Rye wafer crackers. Meats and other proteins Navy beans, kidney beans, and pinto beans. Soybeans. Split peas. Lentils. Nuts and seeds. Dairy Fiber-fortified yogurt. Beverages Fiber-fortified soy milk. Fiber-fortified orange juice. Other foods Fiber bars. The items listed above may not be a complete list of recommended foods and beverages. Contact a dietitian for more information. What foods should I avoid? Fruits Fruit juice. Cooked, strained fruit. Vegetables Fried potatoes. Canned vegetables. Well-cooked vegetables. Grains White bread. Pasta made with refined flour. White rice. Meats and other proteins Fatty cuts of meat. Fried chicken or fried fish. Dairy Milk. Yogurt. Cream cheese. Sour cream. Fats and oils Butters. Beverages Soft drinks. Other foods Cakes and pastries. The items listed above may not be a complete list of foods and beverages to avoid. Talk with your dietitian about what choices are best for you. Summary Fiber is a type of carbohydrate. It is found in foods such as fruits, vegetables, whole grains, and beans. A high-fiber diet has many benefits. It can help to prevent constipation, lower blood cholesterol, aid weight loss, and reduce your risk of heart disease, diabetes, and certain cancers. Increase your intake of fiber gradually. Increasing fiber too quickly may cause cramping, bloating, and gas. Drink plenty of water while you increase the amount of fiber you consume. The best sources of fiber include whole fruits and vegetables, whole grains, nuts, seeds, and beans. This information is not  intended to replace advice given to you by your health care provider. Make sure you discuss any questions you have with your health care provider. Document Revised: 09/29/2019 Document Reviewed: 09/29/2019 Elsevier Patient Education  [place].

## 2023-07-23 ENCOUNTER — Ambulatory Visit
Admission: RE | Admit: 2023-07-23 | Discharge: 2023-07-23 | Disposition: A | Discharge status: Home / Self Care | Payer: Self-pay | Source: Ambulatory Visit | Attending: Internal Medicine | Admitting: Internal Medicine

## 2023-07-23 ENCOUNTER — Other Ambulatory Visit: Payer: Self-pay | Admitting: Internal Medicine

## 2023-07-23 DIAGNOSIS — M5412 Radiculopathy, cervical region: Secondary | ICD-10-CM

## 2023-07-24 ENCOUNTER — Other Ambulatory Visit: Payer: Self-pay | Admitting: Internal Medicine

## 2023-07-24 DIAGNOSIS — M5412 Radiculopathy, cervical region: Secondary | ICD-10-CM

## 2023-07-28 ENCOUNTER — Encounter: Payer: Self-pay | Admitting: Internal Medicine

## 2023-07-31 ENCOUNTER — Other Ambulatory Visit: Payer: 59

## 2023-08-03 ENCOUNTER — Ambulatory Visit
Admission: RE | Admit: 2023-08-03 | Discharge: 2023-08-03 | Disposition: A | Discharge status: Home / Self Care | Payer: 59 | Source: Ambulatory Visit | Attending: Internal Medicine | Admitting: Internal Medicine

## 2023-08-03 DIAGNOSIS — M5412 Radiculopathy, cervical region: Secondary | ICD-10-CM

## 2024-05-31 ENCOUNTER — Other Ambulatory Visit: Payer: Self-pay

## 2024-05-31 ENCOUNTER — Emergency Department (HOSPITAL_BASED_OUTPATIENT_CLINIC_OR_DEPARTMENT_OTHER)
Admission: EM | Admit: 2024-05-31 | Discharge: 2024-05-31 | Disposition: A | Discharge status: Home / Self Care | Attending: Emergency Medicine | Admitting: Emergency Medicine

## 2024-05-31 ENCOUNTER — Emergency Department (HOSPITAL_BASED_OUTPATIENT_CLINIC_OR_DEPARTMENT_OTHER): Admitting: Radiology

## 2024-05-31 DIAGNOSIS — M5431 Sciatica, right side: Secondary | ICD-10-CM

## 2024-05-31 DIAGNOSIS — M5441 Lumbago with sciatica, right side: Secondary | ICD-10-CM | POA: Diagnosis not present

## 2024-05-31 DIAGNOSIS — M545 Low back pain, unspecified: Secondary | ICD-10-CM | POA: Diagnosis present

## 2024-05-31 MED ORDER — OXYCODONE-ACETAMINOPHEN 5-325 MG PO TABS
1.0000 | ORAL_TABLET | ORAL | Status: DC | PRN
  Administered 2024-05-31: 1 via ORAL
  Filled 2024-05-31: qty 1

## 2024-05-31 MED ORDER — METHYLPREDNISOLONE 4 MG PO TBPK: ORAL_TABLET | ORAL | 0 refills | Status: AC

## 2024-05-31 MED ORDER — LIDOCAINE 5 % EX PTCH: 1.0000 | MEDICATED_PATCH | CUTANEOUS | 0 refills | Status: AC

## 2024-05-31 MED ORDER — PREDNISONE 50 MG PO TABS
60.0000 mg | ORAL_TABLET | Freq: Once | ORAL | Status: AC
  Administered 2024-05-31: 60 mg via ORAL
  Filled 2024-05-31: qty 1

## 2024-05-31 MED ORDER — LIDOCAINE 5 % EX PTCH
1.0000 | MEDICATED_PATCH | CUTANEOUS | Status: DC
  Administered 2024-05-31: 1 via TRANSDERMAL
  Filled 2024-05-31: qty 1

## 2024-05-31 NOTE — ED Triage Notes (Addendum)
 Lower back and right hip pain several weeks radiating into leg. XR performed no MRI recommended. Stood on tip toes today and felt much worse. Seen at [facility] today and sent here for suspicion of slipped disc-toradol  given. Denies loss of bladder or bowel. Ambulatory to triage.

## 2024-05-31 NOTE — ED Provider Notes (Signed)
 " [place] EMERGENCY DEPARTMENT AT [facility] Provider Note   CSN: [id number] Arrival date & time: 05/31/24  1902     Patient presents with: Back Pain   Jesse Rowe is a 48 y.o. male.  With a history of chronic back pain who presents to the ED for back pain.  Patient has been suffering from right lower back pain and right hip pain for the last few months.  Exacerbated after he stood on his toes and moved a mirror today.  Now with severe right lower back pain radiating to right lateral thigh.  No loss of bowel or urine, history of back surgeries, fevers IV drug use.    Back Pain      Prior to Admission medications  Medication Sig Start Date End Date Taking? Authorizing Provider  lidocaine  (LIDODERM ) 5 % Place 1 patch onto the skin daily. Remove & Discard patch within 12 hours or as directed by MD 05/31/24  Yes Pamella Ozell LABOR, DO  methylPREDNISolone  (MEDROL  DOSEPAK) 4 MG TBPK tablet The Medrol  dose pack follows a specific tapering schedule: Day 1: 24 mg (6 tablets of 4 mg each) Day 2: 20 mg (5 tablets of 4 mg each) Day 3: 16 mg (4 tablets of 4 mg each) Day 4: 12 mg (3 tablets of 4 mg each) Day 5: 8 mg (2 tablets of 4 mg each) Day 6: 4 mg (1 tablet of 4 mg) 05/31/24  Yes Pamella Ozell LABOR, DO  acetaminophen  (TYLENOL ) 500 MG tablet Take 2 tablets (1,000 mg total) by mouth every 6 (six) hours. 10/24/17   Lucien Orren SAILOR, PA-C  albuterol  (PROVENTIL  HFA;VENTOLIN  HFA) 108 (90 BASE) MCG/ACT inhaler Inhale 2 puffs into the lungs every 6 (six) hours as needed. For shortness of breath.    [provider]  ALLOPURINOL PO Take by mouth.    [provider]  budesonide (RHINOCORT ALLERGY) 32 MCG/ACT nasal spray Place 1 spray into both nostrils every other day. Alternate with azelastine    [provider]  cetirizine (ZYRTEC) 10 MG tablet Take 10 mg by mouth daily.    [provider]  EPINEPHrine (EPIPEN 2-PAK IJ) Inject 1 Syringe as directed as needed.  For allergic reaction    [provider]  metoprolol  tartrate (LOPRESSOR ) 100 MG tablet Take 1 tablet (100 mg total) by mouth once for 1 dose. Take 2 hours prior to procedure. Patient not taking: Reported on 06/23/2022 05/07/22 05/07/22  Burnard Debby LABOR, MD  montelukast  (SINGULAIR ) 10 MG tablet Take 10 mg by mouth at bedtime as needed (wheezing).     [provider]  Multiple Vitamin (MULTIVITAMIN WITH MINERALS) TABS tablet Take 1 tablet by mouth daily.    [provider]  rosuvastatin  (CRESTOR ) 20 MG tablet Take 1 tablet (20 mg total) by mouth daily. 05/27/22   Burnard Debby LABOR, MD  terbinafine  (LAMISIL ) 250 MG tablet Take 1 tablet (250 mg total) by mouth daily. 05/14/22   Janit Thresa HERO, DPM    Allergies: Patient has no known allergies.    Review of Systems  Musculoskeletal:  Positive for back pain.    Updated Vital Signs BP 126/74 (BP Location: Right Arm)   Pulse 78   Temp 98.7 F (37.1 C) (Oral)   Resp 18   SpO2 96%   Physical Exam Vitals and nursing note reviewed.  HENT:     Head: Normocephalic and atraumatic.  Eyes:     Pupils: Pupils are equal, round, and reactive to  light.  Cardiovascular:     Rate and Rhythm: Normal rate and regular rhythm.  Pulmonary:     Effort: Pulmonary effort is normal.     Breath sounds: Normal breath sounds.  Abdominal:     Palpations: Abdomen is soft.     Tenderness: There is no abdominal tenderness.  Musculoskeletal:     Comments: No midline tenderness step-off deformity spine Right paraspinal lumbar tenderness  Full strength bilateral upper and lower extremities with sensation intact light touch throughout  Skin:    General: Skin is warm and dry.  Neurological:     Mental Status: He is alert.  Psychiatric:        Mood and Affect: Mood normal.     (all labs ordered are listed, but only abnormal results are displayed) Labs Reviewed - No data to display  EKG: None  Radiology: DG Lumbar Spine  Complete Result Date: 05/31/2024 CLINICAL DATA:  Low back pain EXAM: DG LUMBAR SPINE COMPLETE 4+V COMPARISON:  03/24/2024 FINDINGS: There is no evidence of lumbar spine fracture. Alignment is normal. Intervertebral disc spaces are maintained. IMPRESSION: Negative. Electronically Signed   By: Luke Bun M.D.   On: 05/31/2024 20:55     Procedures   Medications Ordered in the ED  oxyCODONE -acetaminophen  (PERCOCET/ROXICET) 5-325 MG per tablet 1 tablet (1 tablet Oral Given 05/31/24 1942)  lidocaine  (LIDODERM ) 5 % 1 patch (1 patch Transdermal Patch Applied 05/31/24 2206)  predniSONE  (DELTASONE ) tablet 60 mg (60 mg Oral Given 05/31/24 2204)                                    Medical Decision Making 48 year old male presenting for acute on chronic right lower back pain.  X-rays negative.  No red flag symptoms.  Benign physical exam.  Feel this is most likely sciatica flare.  Counseled on symptomatic management.  He has tried Robaxin and anti-inflammatories at home for the sort of pain.  Will give Solu-Medrol  Dosepak and provide first dose of steroids here as well as lidocaine  patches for symptomatic management and he will follow-up with orthopedic back specialist at [facility]  Amount and/or Complexity of Data Reviewed Radiology: ordered.  Risk Prescription drug management.        Final diagnoses:  Sciatica of right side    ED Discharge Orders          Ordered    methylPREDNISolone  (MEDROL  DOSEPAK) 4 MG TBPK tablet        05/31/24 2223    lidocaine  (LIDODERM ) 5 %  Every 24 hours        05/31/24 2223               Pamella Ozell LABOR, DO 05/31/24 2224  "

## 2024-05-31 NOTE — Discharge Instructions (Addendum)
 You were seen in the emerged ferment for back pain The x-ray did not show fracture or dislocation This is most likely flare of sciatica We gave you a Medrol  Dosepak to help with the inflammation as well as a prescription for lidocaine  patches Follow-up with the specialist at [facility] Return to the emerged part for severe pain if you are unable to walk or have any other concerns

## 2024-06-07 ENCOUNTER — Other Ambulatory Visit (HOSPITAL_COMMUNITY): Payer: Self-pay | Admitting: Internal Medicine

## 2024-06-07 DIAGNOSIS — M545 Low back pain, unspecified: Secondary | ICD-10-CM

## 2024-06-08 ENCOUNTER — Ambulatory Visit (HOSPITAL_COMMUNITY)
Admission: RE | Admit: 2024-06-08 | Discharge: 2024-06-08 | Disposition: A | Discharge status: Home / Self Care | Source: Ambulatory Visit | Attending: Internal Medicine | Admitting: Internal Medicine

## 2024-06-08 DIAGNOSIS — M545 Low back pain, unspecified: Secondary | ICD-10-CM | POA: Insufficient documentation
